# Patient Record
Sex: Male | Born: 1955 | Race: Black or African American | Hispanic: No | Marital: Married | Smoking: Former smoker
Health system: Southern US, Community
[De-identification: ages and names within clinical notes are randomized; demographics above are authoritative.]

## PROBLEM LIST (undated history)

## (undated) DIAGNOSIS — I10 Essential (primary) hypertension: Secondary | ICD-10-CM

## (undated) DIAGNOSIS — K219 Gastro-esophageal reflux disease without esophagitis: Secondary | ICD-10-CM

## (undated) DIAGNOSIS — R609 Edema, unspecified: Secondary | ICD-10-CM

## (undated) DIAGNOSIS — E669 Obesity, unspecified: Secondary | ICD-10-CM

## (undated) DIAGNOSIS — D649 Anemia, unspecified: Secondary | ICD-10-CM

## (undated) DIAGNOSIS — B1920 Unspecified viral hepatitis C without hepatic coma: Secondary | ICD-10-CM

## (undated) DIAGNOSIS — N529 Male erectile dysfunction, unspecified: Secondary | ICD-10-CM

## (undated) DIAGNOSIS — E782 Mixed hyperlipidemia: Secondary | ICD-10-CM

## (undated) DIAGNOSIS — R809 Proteinuria, unspecified: Secondary | ICD-10-CM

## (undated) DIAGNOSIS — E538 Deficiency of other specified B group vitamins: Secondary | ICD-10-CM

## (undated) DIAGNOSIS — E785 Hyperlipidemia, unspecified: Secondary | ICD-10-CM

## (undated) DIAGNOSIS — R0602 Shortness of breath: Secondary | ICD-10-CM

## (undated) DIAGNOSIS — E7800 Pure hypercholesterolemia, unspecified: Secondary | ICD-10-CM

## (undated) HISTORY — DX: Proteinuria, unspecified: R80.9

## (undated) HISTORY — PX: CERVICAL DISC SURGERY: SHX588

## (undated) HISTORY — DX: Male erectile dysfunction, unspecified: N52.9

## (undated) HISTORY — DX: Essential (primary) hypertension: I10

## (undated) HISTORY — DX: Hyperlipidemia, unspecified: E78.5

## (undated) HISTORY — PX: PENILE PROSTHESIS IMPLANT: SHX240

## (undated) HISTORY — DX: Obesity, unspecified: E66.9

## (undated) HISTORY — DX: Edema, unspecified: R60.9

## (undated) HISTORY — DX: Gastro-esophageal reflux disease without esophagitis: K21.9

## (undated) HISTORY — DX: Pure hypercholesterolemia, unspecified: E78.00

## (undated) HISTORY — DX: Anemia, unspecified: D64.9

## (undated) HISTORY — PX: MANDIBLE FRACTURE SURGERY: SHX706

## (undated) HISTORY — DX: Mixed hyperlipidemia: E78.2

## (undated) HISTORY — DX: Deficiency of other specified B group vitamins: E53.8

## (undated) HISTORY — DX: Unspecified viral hepatitis C without hepatic coma: B19.20

---

## 2004-01-29 ENCOUNTER — Emergency Department (HOSPITAL_COMMUNITY): Admission: EM | Admit: 2004-01-29 | Discharge: 2004-01-29 | Payer: Self-pay | Admitting: Emergency Medicine

## 2004-02-03 ENCOUNTER — Ambulatory Visit: Payer: Self-pay | Admitting: Internal Medicine

## 2004-02-03 ENCOUNTER — Ambulatory Visit: Payer: Self-pay | Admitting: *Deleted

## 2004-02-18 ENCOUNTER — Ambulatory Visit: Payer: Self-pay | Admitting: *Deleted

## 2004-03-06 ENCOUNTER — Ambulatory Visit: Payer: Self-pay | Admitting: Internal Medicine

## 2004-04-12 ENCOUNTER — Ambulatory Visit: Payer: Self-pay | Admitting: Internal Medicine

## 2004-05-02 ENCOUNTER — Ambulatory Visit: Payer: Self-pay | Admitting: Internal Medicine

## 2004-05-05 ENCOUNTER — Emergency Department (HOSPITAL_COMMUNITY): Admission: EM | Admit: 2004-05-05 | Discharge: 2004-05-05 | Payer: Self-pay | Admitting: Emergency Medicine

## 2004-05-15 ENCOUNTER — Ambulatory Visit: Payer: Self-pay | Admitting: Internal Medicine

## 2004-05-15 ENCOUNTER — Ambulatory Visit (HOSPITAL_COMMUNITY): Admission: RE | Admit: 2004-05-15 | Discharge: 2004-05-15 | Payer: Self-pay | Admitting: Internal Medicine

## 2004-06-02 ENCOUNTER — Ambulatory Visit: Payer: Self-pay | Admitting: Internal Medicine

## 2004-07-03 ENCOUNTER — Ambulatory Visit: Payer: Self-pay | Admitting: Internal Medicine

## 2004-07-31 ENCOUNTER — Ambulatory Visit: Payer: Self-pay | Admitting: Internal Medicine

## 2004-08-18 ENCOUNTER — Encounter: Admission: RE | Admit: 2004-08-18 | Discharge: 2004-08-18 | Payer: Self-pay | Admitting: Orthopaedic Surgery

## 2004-09-26 ENCOUNTER — Ambulatory Visit: Payer: Self-pay | Admitting: Internal Medicine

## 2004-10-04 ENCOUNTER — Ambulatory Visit: Payer: Self-pay | Admitting: Cardiology

## 2004-10-13 ENCOUNTER — Encounter (INDEPENDENT_AMBULATORY_CARE_PROVIDER_SITE_OTHER): Payer: Self-pay | Admitting: *Deleted

## 2004-10-13 ENCOUNTER — Ambulatory Visit (HOSPITAL_COMMUNITY): Admission: RE | Admit: 2004-10-13 | Discharge: 2004-10-13 | Payer: Self-pay | Admitting: Internal Medicine

## 2005-05-01 ENCOUNTER — Ambulatory Visit (HOSPITAL_BASED_OUTPATIENT_CLINIC_OR_DEPARTMENT_OTHER): Admission: RE | Admit: 2005-05-01 | Discharge: 2005-05-01 | Payer: Self-pay | Admitting: Internal Medicine

## 2005-05-06 ENCOUNTER — Ambulatory Visit: Payer: Self-pay | Admitting: Internal Medicine

## 2005-06-14 ENCOUNTER — Inpatient Hospital Stay (HOSPITAL_COMMUNITY): Admission: RE | Admit: 2005-06-14 | Discharge: 2005-06-15 | Payer: Self-pay | Admitting: Orthopaedic Surgery

## 2006-12-16 ENCOUNTER — Emergency Department (HOSPITAL_COMMUNITY): Admission: EM | Admit: 2006-12-16 | Discharge: 2006-12-16 | Payer: Self-pay | Admitting: Emergency Medicine

## 2007-09-25 ENCOUNTER — Encounter: Admission: RE | Admit: 2007-09-25 | Discharge: 2007-09-25 | Payer: Self-pay | Admitting: Family Medicine

## 2008-01-20 ENCOUNTER — Encounter: Admission: RE | Admit: 2008-01-20 | Discharge: 2008-01-20 | Payer: Self-pay | Admitting: Family Medicine

## 2008-11-02 ENCOUNTER — Emergency Department (HOSPITAL_COMMUNITY): Admission: EM | Admit: 2008-11-02 | Discharge: 2008-11-03 | Payer: Self-pay | Admitting: Emergency Medicine

## 2009-01-18 ENCOUNTER — Encounter: Admission: RE | Admit: 2009-01-18 | Discharge: 2009-01-18 | Payer: Self-pay | Admitting: Family Medicine

## 2009-05-14 ENCOUNTER — Emergency Department (HOSPITAL_COMMUNITY): Admission: EM | Admit: 2009-05-14 | Discharge: 2009-05-15 | Payer: Self-pay | Admitting: Emergency Medicine

## 2009-05-23 ENCOUNTER — Encounter: Admission: RE | Admit: 2009-05-23 | Discharge: 2009-05-23 | Payer: Self-pay | Admitting: Family Medicine

## 2009-09-18 ENCOUNTER — Emergency Department (HOSPITAL_COMMUNITY): Admission: EM | Admit: 2009-09-18 | Discharge: 2009-09-18 | Payer: Self-pay | Admitting: Emergency Medicine

## 2010-05-24 ENCOUNTER — Observation Stay (HOSPITAL_COMMUNITY)
Admission: EM | Admit: 2010-05-24 | Discharge: 2010-05-28 | Payer: Self-pay | Source: Home / Self Care | Attending: Internal Medicine | Admitting: Internal Medicine

## 2010-05-26 ENCOUNTER — Encounter (INDEPENDENT_AMBULATORY_CARE_PROVIDER_SITE_OTHER): Payer: Self-pay | Admitting: Internal Medicine

## 2010-06-25 ENCOUNTER — Encounter: Payer: Self-pay | Admitting: Orthopaedic Surgery

## 2010-08-14 LAB — URINE CULTURE
Colony Count: NO GROWTH
Culture: NO GROWTH
Special Requests: NEGATIVE

## 2010-08-14 LAB — BASIC METABOLIC PANEL
BUN: 13 mg/dL (ref 6–23)
CO2: 25 mEq/L (ref 19–32)
Calcium: 9.1 mg/dL (ref 8.4–10.5)
Chloride: 104 mEq/L (ref 96–112)
Creatinine, Ser: 1.09 mg/dL (ref 0.4–1.5)
GFR calc Af Amer: >60 mL/min (ref >60)
GFR calc non Af Amer: >60 mL/min (ref >60)
Glucose, Bld: 183 mg/dL — ABNORMAL HIGH (ref 70–99)
Potassium: 4.1 mEq/L (ref 3.5–5.1)
Sodium: 135 mEq/L (ref 135–145)

## 2010-08-14 LAB — CBC
HCT: 35.1 % — ABNORMAL LOW (ref 39.0–52.0)
HCT: 36.3 % — ABNORMAL LOW (ref 39.0–52.0)
Hemoglobin: 11.5 g/dL — ABNORMAL LOW (ref 13.0–17.0)
Hemoglobin: 11.9 g/dL — ABNORMAL LOW (ref 13.0–17.0)
Hemoglobin: 12.4 g/dL — ABNORMAL LOW (ref 13.0–17.0)
MCH: 28.6 pg (ref 26.0–34.0)
MCH: 28.7 pg (ref 26.0–34.0)
MCH: 28.8 pg (ref 26.0–34.0)
MCHC: 34 g/dL (ref 30.0–36.0)
MCHC: 34.2 g/dL (ref 30.0–36.0)
MCV: 84 fL (ref 78.0–100.0)
Platelets: 63 10*3/uL — ABNORMAL LOW (ref 150–400)
Platelets: 73 10*3/uL — ABNORMAL LOW (ref 150–400)
RBC: 4.02 MIL/uL — ABNORMAL LOW (ref 4.22–5.81)
RBC: 4.32 MIL/uL (ref 4.22–5.81)
RDW: 13.3 % (ref 11.5–15.5)
RDW: 13.4 % (ref 11.5–15.5)
WBC: 6 10*3/uL (ref 4.0–10.5)
WBC: 6.2 10*3/uL (ref 4.0–10.5)
WBC: 8.5 10*3/uL (ref 4.0–10.5)

## 2010-08-14 LAB — GLUCOSE, CAPILLARY
Glucose-Capillary: 173 mg/dL — ABNORMAL HIGH (ref 70–99)
Glucose-Capillary: 173 mg/dL — ABNORMAL HIGH (ref 70–99)
Glucose-Capillary: 186 mg/dL — ABNORMAL HIGH (ref 70–99)
Glucose-Capillary: 191 mg/dL — ABNORMAL HIGH (ref 70–99)
Glucose-Capillary: 244 mg/dL — ABNORMAL HIGH (ref 70–99)
Glucose-Capillary: 292 mg/dL — ABNORMAL HIGH (ref 70–99)

## 2010-08-14 LAB — LIPID PANEL
Cholesterol: 153 mg/dL (ref 0–200)
HDL: 45 mg/dL (ref >39)
LDL Cholesterol: 83 mg/dL (ref 0–99)
Total CHOL/HDL Ratio: 3.4 RATIO
Triglycerides: 127 mg/dL (ref <150)
VLDL: 25 mg/dL (ref 0–40)

## 2010-08-14 LAB — DIFFERENTIAL
Basophils Absolute: 0 10*3/uL (ref 0.0–0.1)
Basophils Absolute: 0 10*3/uL (ref 0.0–0.1)
Basophils Relative: 0 % (ref 0–1)
Basophils Relative: 0 % (ref 0–1)
Eosinophils Absolute: 0 10*3/uL (ref 0.0–0.7)
Eosinophils Absolute: 0.1 10*3/uL (ref 0.0–0.7)
Eosinophils Relative: 1 % (ref 0–5)
Lymphocytes Relative: 15 % (ref 12–46)
Lymphs Abs: 0.9 10*3/uL (ref 0.7–4.0)
Monocytes Absolute: 1 10*3/uL (ref 0.1–1.0)
Monocytes Relative: 13 % — ABNORMAL HIGH (ref 3–12)
Monocytes Relative: 16 % — ABNORMAL HIGH (ref 3–12)
Neutro Abs: 4.1 10*3/uL (ref 1.7–7.7)
Neutrophils Relative %: 59 % (ref 43–77)
Neutrophils Relative %: 68 % (ref 43–77)

## 2010-08-14 LAB — URINALYSIS, ROUTINE W REFLEX MICROSCOPIC
Bilirubin Urine: NEGATIVE
Hgb urine dipstick: NEGATIVE
Protein, ur: 30 mg/dL — AB
Specific Gravity, Urine: 1.015 (ref 1.005–1.030)
Urobilinogen, UA: 1 mg/dL (ref 0.0–1.0)

## 2010-08-14 LAB — POCT CARDIAC MARKERS
CKMB, poc: <1 ng/mL — ABNORMAL LOW (ref 1.0–8.0)
Troponin i, poc: <0.05 ng/mL (ref 0.00–0.09)
Troponin i, poc: <0.05 ng/mL (ref 0.00–0.09)

## 2010-08-14 LAB — COMPREHENSIVE METABOLIC PANEL
ALT: 102 U/L — ABNORMAL HIGH (ref 0–53)
ALT: 78 U/L — ABNORMAL HIGH (ref 0–53)
AST: 105 U/L — ABNORMAL HIGH (ref 0–37)
AST: 80 U/L — ABNORMAL HIGH (ref 0–37)
Alkaline Phosphatase: 32 U/L — ABNORMAL LOW (ref 39–117)
Alkaline Phosphatase: 46 U/L (ref 39–117)
CO2: 28 mEq/L (ref 19–32)
CO2: 28 mEq/L (ref 19–32)
Chloride: 101 mEq/L (ref 96–112)
Chloride: 106 mEq/L (ref 96–112)
Creatinine, Ser: 0.95 mg/dL (ref 0.4–1.5)
GFR calc Af Amer: >60 mL/min (ref >60)
GFR calc Af Amer: >60 mL/min (ref >60)
GFR calc non Af Amer: 54 mL/min — ABNORMAL LOW (ref >60)
GFR calc non Af Amer: >60 mL/min (ref >60)
Potassium: 3.8 mEq/L (ref 3.5–5.1)
Sodium: 138 mEq/L (ref 135–145)
Sodium: 139 mEq/L (ref 135–145)
Total Bilirubin: 1.2 mg/dL (ref 0.3–1.2)
Total Bilirubin: 1.3 mg/dL — ABNORMAL HIGH (ref 0.3–1.2)

## 2010-08-14 LAB — BLOOD GAS, VENOUS
Acid-Base Excess: 1.1 mmol/L (ref 0.0–2.0)
O2 Saturation: 82 %
TCO2: 22.5 mmol/L (ref 0–100)
pCO2, Ven: 39.1 mmHg — ABNORMAL LOW (ref 45.0–50.0)
pO2, Ven: 42.6 mmHg (ref 30.0–45.0)

## 2010-08-14 LAB — CULTURE, BLOOD (ROUTINE X 2)
Culture  Setup Time: 201112230218
Culture: NO GROWTH

## 2010-08-14 LAB — MRSA CULTURE

## 2010-08-14 LAB — HEMOGLOBIN A1C
Hgb A1c MFr Bld: 9.6 % — ABNORMAL HIGH (ref <5.7)
Mean Plasma Glucose: 229 mg/dL — ABNORMAL HIGH (ref <117)

## 2010-08-14 LAB — PROTIME-INR
INR: 1.1 (ref 0.00–1.49)
Prothrombin Time: 14.4 seconds (ref 11.6–15.2)

## 2010-08-14 LAB — CARDIAC PANEL(CRET KIN+CKTOT+MB+TROPI)
CK, MB: 0.9 ng/mL (ref 0.3–4.0)
Total CK: 404 U/L — ABNORMAL HIGH (ref 7–232)
Troponin I: 0.01 ng/mL (ref 0.00–0.06)
Troponin I: 0.01 ng/mL (ref 0.00–0.06)

## 2010-08-14 LAB — HEPATIC FUNCTION PANEL
Albumin: 3.4 g/dL — ABNORMAL LOW (ref 3.5–5.2)
Total Bilirubin: 1.3 mg/dL — ABNORMAL HIGH (ref 0.3–1.2)
Total Protein: 7.5 g/dL (ref 6.0–8.3)

## 2010-08-14 LAB — URINE MICROSCOPIC-ADD ON

## 2010-08-14 LAB — HEMOCCULT GUIAC POC 1CARD (OFFICE): Fecal Occult Bld: NEGATIVE

## 2010-08-22 LAB — GLUCOSE, CAPILLARY: Glucose-Capillary: 397 mg/dL — ABNORMAL HIGH (ref 70–99)

## 2010-09-05 LAB — POCT I-STAT, CHEM 8
BUN: 21 mg/dL (ref 6–23)
Calcium, Ion: 1.16 mmol/L (ref 1.12–1.32)
HCT: 41 % (ref 39.0–52.0)
Sodium: 130 mEq/L — ABNORMAL LOW (ref 135–145)
TCO2: 25 mmol/L (ref 0–100)

## 2010-09-05 LAB — GLUCOSE, CAPILLARY
Glucose-Capillary: 449 mg/dL — ABNORMAL HIGH (ref 70–99)
Glucose-Capillary: 582 mg/dL (ref 70–99)

## 2010-09-05 LAB — DIFFERENTIAL
Basophils Absolute: 0 10*3/uL (ref 0.0–0.1)
Basophils Relative: 0 % (ref 0–1)
Eosinophils Relative: 0 % (ref 0–5)
Lymphocytes Relative: 26 % (ref 12–46)
Monocytes Absolute: 0.4 10*3/uL (ref 0.1–1.0)
Neutro Abs: 3.2 10*3/uL (ref 1.7–7.7)

## 2010-09-05 LAB — URINALYSIS, ROUTINE W REFLEX MICROSCOPIC
Bilirubin Urine: NEGATIVE
Ketones, ur: NEGATIVE mg/dL
Nitrite: NEGATIVE
Specific Gravity, Urine: 1.025 (ref 1.005–1.030)
Urobilinogen, UA: 1 mg/dL (ref 0.0–1.0)

## 2010-09-05 LAB — KETONES, QUALITATIVE: Acetone, Bld: NEGATIVE

## 2010-09-05 LAB — CBC
HCT: 38.5 % — ABNORMAL LOW (ref 39.0–52.0)
MCHC: 34 g/dL (ref 30.0–36.0)
Platelets: 64 10*3/uL — ABNORMAL LOW (ref 150–400)
RDW: 13.7 % (ref 11.5–15.5)

## 2010-09-11 LAB — COMPREHENSIVE METABOLIC PANEL
ALT: 83 U/L — ABNORMAL HIGH (ref 0–53)
AST: 51 U/L — ABNORMAL HIGH (ref 0–37)
CO2: 24 mEq/L (ref 19–32)
Chloride: 93 mEq/L — ABNORMAL LOW (ref 96–112)
GFR calc Af Amer: >60 mL/min (ref >60)
GFR calc non Af Amer: 59 mL/min — ABNORMAL LOW (ref >60)
Sodium: 126 mEq/L — ABNORMAL LOW (ref 135–145)
Total Bilirubin: 1.4 mg/dL — ABNORMAL HIGH (ref 0.3–1.2)

## 2010-09-11 LAB — GLUCOSE, CAPILLARY
Glucose-Capillary: 305 mg/dL — ABNORMAL HIGH (ref 70–99)
Glucose-Capillary: 351 mg/dL — ABNORMAL HIGH (ref 70–99)
Glucose-Capillary: 375 mg/dL — ABNORMAL HIGH (ref 70–99)
Glucose-Capillary: >600 mg/dL (ref 70–99)
Glucose-Capillary: >600 mg/dL (ref 70–99)

## 2010-09-11 LAB — URINALYSIS, ROUTINE W REFLEX MICROSCOPIC
Bilirubin Urine: NEGATIVE
Ketones, ur: NEGATIVE mg/dL
Nitrite: NEGATIVE
pH: 5.5 (ref 5.0–8.0)

## 2010-09-11 LAB — DIFFERENTIAL
Basophils Absolute: 0 10*3/uL (ref 0.0–0.1)
Eosinophils Absolute: 0.1 10*3/uL (ref 0.0–0.7)
Eosinophils Relative: 1 % (ref 0–5)

## 2010-09-11 LAB — CBC
RBC: 4.57 MIL/uL (ref 4.22–5.81)
WBC: 5.7 10*3/uL (ref 4.0–10.5)

## 2010-09-15 ENCOUNTER — Emergency Department (HOSPITAL_COMMUNITY)
Admission: EM | Admit: 2010-09-15 | Discharge: 2010-09-15 | Disposition: A | Discharge status: Home / Self Care | Payer: Medicare PPO | Attending: Emergency Medicine | Admitting: Emergency Medicine

## 2010-09-15 DIAGNOSIS — K219 Gastro-esophageal reflux disease without esophagitis: Secondary | ICD-10-CM | POA: Insufficient documentation

## 2010-09-15 DIAGNOSIS — R109 Unspecified abdominal pain: Secondary | ICD-10-CM | POA: Insufficient documentation

## 2010-09-15 DIAGNOSIS — I10 Essential (primary) hypertension: Secondary | ICD-10-CM | POA: Insufficient documentation

## 2010-09-15 DIAGNOSIS — E119 Type 2 diabetes mellitus without complications: Secondary | ICD-10-CM | POA: Insufficient documentation

## 2010-09-15 LAB — DIFFERENTIAL
Basophils Absolute: 0 10*3/uL (ref 0.0–0.1)
Basophils Relative: 0 % (ref 0–1)
Neutro Abs: 3.5 10*3/uL (ref 1.7–7.7)
Neutrophils Relative %: 59 % (ref 43–77)

## 2010-09-15 LAB — URINALYSIS, ROUTINE W REFLEX MICROSCOPIC
Ketones, ur: NEGATIVE mg/dL
Leukocytes, UA: NEGATIVE
Nitrite: NEGATIVE
Protein, ur: 100 mg/dL — AB
Urobilinogen, UA: 1 mg/dL (ref 0.0–1.0)
pH: 5 (ref 5.0–8.0)

## 2010-09-15 LAB — CBC
Hemoglobin: 12.6 g/dL — ABNORMAL LOW (ref 13.0–17.0)
Platelets: 81 10*3/uL — ABNORMAL LOW (ref 150–400)
RBC: 4.42 MIL/uL (ref 4.22–5.81)
WBC: 6 10*3/uL (ref 4.0–10.5)

## 2010-09-15 LAB — COMPREHENSIVE METABOLIC PANEL
ALT: 86 U/L — ABNORMAL HIGH (ref 0–53)
AST: 69 U/L — ABNORMAL HIGH (ref 0–37)
Albumin: 3.5 g/dL (ref 3.5–5.2)
CO2: 31 mEq/L (ref 19–32)
Chloride: 102 mEq/L (ref 96–112)
GFR calc Af Amer: >60 mL/min (ref >60)
GFR calc non Af Amer: >60 mL/min (ref >60)
Sodium: 139 mEq/L (ref 135–145)
Total Bilirubin: 0.9 mg/dL (ref 0.3–1.2)

## 2010-09-15 LAB — URINE MICROSCOPIC-ADD ON

## 2010-09-15 LAB — POCT CARDIAC MARKERS
CKMB, poc: 1.8 ng/mL (ref 1.0–8.0)
Myoglobin, poc: 163 ng/mL (ref 12–200)
Troponin i, poc: <0.05 ng/mL (ref 0.00–0.09)

## 2010-10-20 NOTE — Procedures (Signed)
Fernando Peterson, Fernando Peterson                ACCOUNT NO.:  [id number]   MEDICAL RECORD NO.:  [id number]          PATIENT TYPE:  OUT   LOCATION:  SLEEP CENTER                 FACILITY:  [facility]   PHYSICIAN:  Clinton D. Maple Hudson, M.D. DATE OF BIRTH:  11-14-1955   DATE OF STUDY:                              NOCTURNAL POLYSOMNOGRAM   REFERRING PHYSICIAN:  Dr. Dorise Hiss.   DATE OF STUDY:  May 01, 2005.   INDICATION FOR STUDY:  Insomnia with sleep apnea.   EPWORTH SLEEPINESS SCORE:  8/24.   BMI:  32.   WEIGHT:  234 pounds.   SLEEP ARCHITECTURE:  Total sleep time 339 minutes with sleep efficiency 77%.  Stage I was 24%, stage II 63%, stages III and IV were absent, REM 13% of  total sleep time. Sleep latency 29 minutes, REM latency 81 minutes, awake  after sleep onset 74 minutes, arousal index of 30.8. Ambien was taken at  bedtime.   RESPIRATORY DATA:  Split study protocol. Apnea/hypopnea index (AHI, RDI)  61.2 obstructive events per hour indicating severe obstructive sleep  apnea/hypopnea syndrome before C-PAP. This included 33 obstructive apneas  and 99 hypopneas before C-PAP. Events were not positional but more common  while supine. REM AHI 14.8 per hour. C-PAP was successfully titrated to 16  CWP, AHI 1.3 per hour. A large ultra mirage ResMed full-face mask was used  with heated humidifier.   OXYGEN DATA:  Moderate to loud snoring with oxygen desaturation to a nadir  of 74% before C-PAP. After C-PAP titration, saturation held 94-96% on room  air.   CARDIAC DATA:  Normal sinus rhythm.   MOVEMENT/PARASOMNIA:  Frequent limb jerks with a total of 216 counted, but  only 9 were associated with arousal or awakening for an unremarkable  periodic limb movement with arousal index of 1.6 per hour.   IMPRESSION/RECOMMENDATIONS:  1.  Severe obstructive sleep apnea/hypopnea syndrome, AHI 61.2 per hour      nonpositional events with moderate to loud snoring and oxygen      desaturation to  74%.  2.  Successful C-PAP titration to 16 CWP, AHI 1.3 per hour. A large ultra      mirage ResMed full-face mask was used with a heated humidifier.      Clinton D. Maple Hudson, M.D.  Diplomate, Biomedical engineer of Sleep Medicine  Electronically Signed     CDY/MEDQ  D:  05/06/2005 10:32:21  T:  05/06/2005 22:45:33  Job:  [id number]

## 2010-10-20 NOTE — Op Note (Signed)
NAMEJOSEAN, LYCAN                ACCOUNT NO.:  [id number]   MEDICAL RECORD NO.:  [id number]          PATIENT TYPE:  OIB   LOCATION:  3027                         FACILITY:  [facility]   PHYSICIAN:  Sharolyn Douglas, M.D.        DATE OF BIRTH:  02-Jun-1956   DATE OF PROCEDURE:  06/14/2005  DATE OF DISCHARGE:  06/15/2005                                 OPERATIVE REPORT   PREOPERATIVE DIAGNOSIS:  1.  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  2.  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  3.  Chronic neck and right greater than left upper extremity numbness and      pain.   PROCEDURE:  1.  Exploration of C5-6 anterior cervical fusion with removal of anterior      cervical plate.  2.  Anterior cervical diskectomy C4-5.  3.  C6 corpectomy with take down of pseudoarthrosis.  4.  Anterior cervical fusion C4-5, C5-6 and C6-7.  5.  Allograft strut x2 packed with local autogenous bone graft.  6.  Anterior cervical plating C4-C7.  7.  Operating microscope.   SURGEON:  Sharolyn Douglas, M.D.   ASSISTANT:  Verlin Fester, P.A.   ANESTHESIA:  General endotracheal.   COMPLICATIONS:  None.   ESTIMATED BLOOD LOSS:  50 cc.   INDICATIONS:  The patient is a pleasant 55 year old male with chronic  progressive neck and right greater than left upper extremity pain, numbness  and tingling.  He had a previous anterior cervical fusion done five 5 years  ago by another Careers adviser.  He now has a pseudoarthrosis at C5-6, foraminal  narrowing at C4-5.  The graft has subsided into the C6 vertebral body, and  the plate has collapsed.  In order to reconstruct the anterior cervical  spine, C6 corpectomy needed to be done so that a strut could be placed from  C5-C7 and instrumentation from C4-C7 after ACDF at C4-5 to address foraminal  narrowing. Originally, we had hoped to limit the surgery from C4-C6, but  after careful review of the CT scan it became apparent that there was  not  enough vertebral body of C6 left to complete the reconstruction.  This was  reviewed with the patient.  He understands and elected to proceed.  Risks  and benefits also extensively discussed.   DESCRIPTION OF PROCEDURE:  The patient was identified in the holding area,  taken to the operating room and underwent general endotracheal anesthesia  without difficulty, given prophylactic IV antibiotics.  Carefully positioned  on the operating room table with Mayfield headrest and  5 pounds of halter  traction applied.  Neck prepped and draped in the usual sterile fashion. A  transverse incision was made at the level of the cricoid cartilage.  Dissection was carried sharply through platysma.  The interval between the  SCM and strap muscles was developed down to the prevertebral space.  The  previous scar was encountered and careful dissection was completed to  identify the esophagus, trachea and carotid sheath. These structures were  then protected at  all times.  The prevertebral fascia was opened and a wide  exposure was carried out of the anterior cervical spine from C4 down to C7.  The previous plate was identified.  This was cleaned of all soft tissue.  Deep retractors were placed.  The plate was then removed using the  appropriate DePuy screwdrivers.  There was wear debris around the screws and  plate indicating that there was ongoing motion. Once the hardware had been  removed, we then completed a careful evaluation of the fusion at C5-6.  There was a clear pseudoarthrosis with subsidence of the graft into the C6  vertebral body.  The Leica microscope was draped and brought into the field.  Caspar distraction pins were placed into the V4-C5 vertebral bodies and  distraction applied. We then completed diskectomy at C4-5.  High-speed bur  used to take down the cartilaginous endplates at the posterior vertebral  margins and the uncovertebral joints.  A 2 mm Kerrison used to complete   decompression, undercutting the vertebral margins and completing wide  foraminotomies.  We then placed a 9 mm Allograft prosthesis spacer which was  packed with local bone graft into the interspace and carefully countersunk 2  mm.  We then removed the Caspar distraction pin from C4 and placed this into  the C7 vertebral body.  We applied distraction across the C5-6 and C6-7 disk  space.  The disk was completely removed between C6 and C7.  Foraminotomies  were completed, and the C7 end-plate was carefully prepared.  We then used a  bur to complete the corpectomy of C6 back to the posterior vertebral wall.  We carried the corpectomy up through the previous pseudoarthrosis until we  reached good bone at C5.  This was carefully contoured creating a level  surface.  We completed foraminotomies.  We then measured and cut a fibular  Allograft strut.  This was packed with local bone graft obtained from the  corpectomy.  It was carefully countersunk into the interspace.  The  carpentry was good, and there was excellent contact with the end-plates.  We  then packed the remaining local bone graft from the corpectomy around the  Allograft strut.  At this point, we turned our attention to placing anterior  cervical plate from the J1-B1 from the [employer] spine system. We used six 13  mm locking screws.  We had excellent screw purchase.  Screws were placed to  the C4-C5 and C7 vertebral bodies.  We ensured that the locking mechanism  engaged.  The wound was irrigated.  X-ray taken showed appropriate levels  and positioning of the implant.  The esophagus, trachea and carotid sheath  were inspected. There were no apparent injuries.  Deep Penrose drain was  left.  The platysma was closed with interrupted 2-0 Vicryl, subcutaneous  layer closed with interrupted 3-0 Vicryl and the skin approximated with a 3- 0 subcuticular Vicryl suture.  Benzoin and Steri-Strips placed.  Sterile  dressing applied.  The patient  was placed into a cervical collar, extubated  without difficulty and transferred to recovery in stable condition.  Evaluation in the he recovery room postoperatively showed good strength.  The patient was reporting improved pain and sensation in the right upper  extremity.      Sharolyn Douglas, M.D.  Electronically Signed     MC/MEDQ  D:  06/15/2005  T:  06/17/2005  Job:  [id number]

## 2010-12-23 ENCOUNTER — Emergency Department (HOSPITAL_COMMUNITY): Payer: Medicare PPO

## 2010-12-23 ENCOUNTER — Emergency Department (HOSPITAL_COMMUNITY)
Admission: EM | Admit: 2010-12-23 | Discharge: 2010-12-23 | Disposition: A | Discharge status: Home / Self Care | Payer: Medicare PPO | Attending: Emergency Medicine | Admitting: Emergency Medicine

## 2010-12-23 DIAGNOSIS — Z8619 Personal history of other infectious and parasitic diseases: Secondary | ICD-10-CM | POA: Insufficient documentation

## 2010-12-23 DIAGNOSIS — K7460 Unspecified cirrhosis of liver: Secondary | ICD-10-CM | POA: Insufficient documentation

## 2010-12-23 DIAGNOSIS — I10 Essential (primary) hypertension: Secondary | ICD-10-CM | POA: Insufficient documentation

## 2010-12-23 DIAGNOSIS — R109 Unspecified abdominal pain: Secondary | ICD-10-CM | POA: Insufficient documentation

## 2010-12-23 DIAGNOSIS — Z794 Long term (current) use of insulin: Secondary | ICD-10-CM | POA: Insufficient documentation

## 2010-12-23 DIAGNOSIS — E119 Type 2 diabetes mellitus without complications: Secondary | ICD-10-CM | POA: Insufficient documentation

## 2010-12-23 DIAGNOSIS — R748 Abnormal levels of other serum enzymes: Secondary | ICD-10-CM | POA: Insufficient documentation

## 2010-12-23 DIAGNOSIS — K8020 Calculus of gallbladder without cholecystitis without obstruction: Secondary | ICD-10-CM | POA: Insufficient documentation

## 2010-12-23 LAB — GLUCOSE, CAPILLARY: Glucose-Capillary: 345 mg/dL — ABNORMAL HIGH (ref 70–99)

## 2010-12-23 LAB — DIFFERENTIAL
Basophils Absolute: 0 10*3/uL (ref 0.0–0.1)
Basophils Relative: 0 % (ref 0–1)
Lymphocytes Relative: 24 % (ref 12–46)
Monocytes Absolute: 0.5 10*3/uL (ref 0.1–1.0)
Monocytes Relative: 10 % (ref 3–12)
Neutro Abs: 3.6 10*3/uL (ref 1.7–7.7)
Neutrophils Relative %: 65 % (ref 43–77)

## 2010-12-23 LAB — CK TOTAL AND CKMB (NOT AT ARMC): Relative Index: 1.3 (ref 0.0–2.5)

## 2010-12-23 LAB — CBC
MCH: 27.8 pg (ref 26.0–34.0)
MCHC: 34.1 g/dL (ref 30.0–36.0)
Platelets: 81 10*3/uL — ABNORMAL LOW (ref 150–400)
RBC: 4.53 MIL/uL (ref 4.22–5.81)

## 2010-12-23 LAB — LIPASE, BLOOD: Lipase: 44 U/L (ref 11–59)

## 2010-12-23 LAB — COMPREHENSIVE METABOLIC PANEL
ALT: 107 U/L — ABNORMAL HIGH (ref 0–53)
AST: 87 U/L — ABNORMAL HIGH (ref 0–37)
Albumin: 3.6 g/dL (ref 3.5–5.2)
Calcium: 9.6 mg/dL (ref 8.4–10.5)
Sodium: 132 mEq/L — ABNORMAL LOW (ref 135–145)
Total Protein: 8.2 g/dL (ref 6.0–8.3)

## 2010-12-29 ENCOUNTER — Encounter (INDEPENDENT_AMBULATORY_CARE_PROVIDER_SITE_OTHER): Payer: Self-pay | Admitting: General Surgery

## 2010-12-29 ENCOUNTER — Ambulatory Visit (INDEPENDENT_AMBULATORY_CARE_PROVIDER_SITE_OTHER): Payer: Medicare PPO | Admitting: General Surgery

## 2010-12-29 VITALS — BP 130/78 | HR 72 | Temp 97.6°F | Ht 71.0 in | Wt 256.0 lb

## 2010-12-29 DIAGNOSIS — K8020 Calculus of gallbladder without cholecystitis without obstruction: Secondary | ICD-10-CM

## 2010-12-29 NOTE — Progress Notes (Signed)
Subjective:     Fernando Peterson ID: Fernando Peterson, male   DOB: [birth year], 55 y.o.   MRN: [id number]  HPI This is a pleasant 55 year old African American Fernando Peterson with insulin-dependent diabetes, hypertension, coronary artery disease, morbid obesity, hepatitis C related cirrhosis. Fernando Peterson is referred to me by Dr. Mirna Mires because Fernando Peterson had another gallbladder attack.  I saw Fernando Peterson in the hospital on May 26, 2010. Fernando Peterson had had abdominal pain nausea and vomiting and was found to have gallstones on ultrasound. Fernando Peterson had a cardiac workup which was negative for myocardial infarction. His symptoms resolved in the hospital. Hepatobiliary scan showed nonvisualization of the gallbladder. For some reason Fernando Peterson elected not to have gallbladder surgery at that time. Fernando Peterson was to be referred to gastroenterology for evaluation of his cirrhosis, but it appears that that has never been done.  Over the past 5 months Fernando Peterson has done well and really hasn't had any more attacks. On December 22, 2010 Fernando Peterson went out and ate a big dinner with fried seafood. Fernando Peterson woke up in the early morning hours with upper abdominal and right upper quadrant and back pain, repeated nausea and vomiting, and Fernando Peterson went to the [facility] emergency room. CBC was normal. Complete metabolic panel revealed SGOT of 87, SGPT of 107 and otherwise normal. Lipase was 44. Cardiac enzymes were negative. Gallbladder ultrasound was repeated and showed clustered calcified gallstones, negative sonographic Murphy's sign, no wall thickening, no pericholecystic fluid. Hepatic cirrhosis was commented. Mild splenomegaly was commented on. Fernando Peterson has been asymptomatic since. Dr. Loleta Chance wanted my opinion  I have discussed the situation with the Fernando Peterson, and Fernando Peterson is motivated towards having a gallbladder operation at some point. Fernando Peterson is afraid of complications in the future.  Past Medical History  Diagnosis Date  . Hypertension   . Diabetes mellitus   . GERD (gastroesophageal reflux disease)   . Hepatitis C      Current Outpatient Prescriptions  Medication Sig Dispense Refill  . carvedilol (COREG) 12.5 MG tablet Take 12.5 mg by mouth 2 (two) times daily with a meal.        . colesevelam (WELCHOL) 625 MG tablet Take 1,875 mg by mouth 2 (two) times daily with a meal.        . insulin aspart (NOVOLOG) 100 UNIT/ML injection Inject 28 Units into the skin 3 (three) times daily before meals.        . insulin glargine (LANTUS) 100 UNIT/ML injection Inject 80 Units into the skin 2 (two) times daily.        . insulin NPH (HUMULIN N,NOVOLIN N) 100 UNIT/ML injection Inject 70 Units into the skin.        . metFORMIN (GLUCOPHAGE) 850 MG tablet Take 850 mg by mouth 2 (two) times daily with a meal.        . pioglitazone (ACTOS) 15 MG tablet Take 15 mg by mouth daily.        . pramlintide (SYMLIN) 600 MCG/ML injection Inject 120 mcg into the skin 3 (three) times daily before meals.        . triamterene-hydrochlorothiazide (MAXZIDE-25) 37.5-25 MG per tablet Take 1 tablet by mouth daily.        . valsartan (DIOVAN) 160 MG tablet Take 160 mg by mouth daily.          No Known Allergies  Family History  Problem Relation Age of Onset  . Diabetes Mother   . Heart disease Mother   . Heart disease Sister   .  Diabetes Brother   . Heart disease Brother     History  Substance Use Topics  . Smoking status: Former Smoker -- 1.5 packs/day  . Smokeless tobacco: Not on file  . Alcohol Use: No      Review of Systems  Constitutional: Positive for appetite change. Negative for chills, diaphoresis, activity change, fatigue and unexpected weight change.  HENT: Negative.   Eyes: Negative.   Respiratory: Negative.   Cardiovascular: Negative.   Gastrointestinal: Positive for nausea, vomiting and abdominal pain. Negative for diarrhea, constipation, blood in stool, abdominal distention, anal bleeding and rectal pain.  Genitourinary: Negative.   Musculoskeletal: Negative.   Skin: Negative.   Neurological: Negative.    Hematological: Negative.        Objective:   Physical Exam  Constitutional: Fernando Peterson is oriented to person, place, and time. Fernando Peterson appears well-developed and well-nourished. No distress.  HENT:  Head: Normocephalic and atraumatic.  Mouth/Throat: Oropharyngeal exudate present.  Eyes: Pupils are equal, round, and reactive to light. Left eye exhibits no discharge. No scleral icterus.  Neck: Normal range of motion. Neck supple. No JVD present. No tracheal deviation present. No thyromegaly present.  Cardiovascular: Normal rate, regular rhythm and normal heart sounds.  Exam reveals no gallop.   No murmur heard. Pulmonary/Chest: Breath sounds normal. No respiratory distress. Fernando Peterson has no wheezes. Fernando Peterson has no rales. Fernando Peterson exhibits no tenderness.  Abdominal: Bowel sounds are normal. Fernando Peterson exhibits no distension and no mass. There is no tenderness. There is no rebound and no guarding.       No mass. No organomegaly. Obesity limits exam.  Musculoskeletal: Normal range of motion. Fernando Peterson exhibits no edema and no tenderness.  Lymphadenopathy:    Fernando Peterson has no cervical adenopathy.  Neurological: Fernando Peterson is alert and oriented to person, place, and time.  Skin: Skin is warm and dry. No rash noted. No erythema. No pallor.  Psychiatric: Fernando Peterson has a normal mood and affect. His behavior is normal. Judgment and thought content normal.       Assessment:     Chronic cholecystitis with cholelithiasis. Fernando Peterson has had recurrent episodes of biliary colic, but no overt complications to date. Because of his diabetes and liver disease, Fernando Peterson is at increased risk for complications of his gallstones. Because of his cirrhosis Fernando Peterson is at significant increased risk for cholecystectomy.  Hepatitis C related cirrhosis. Portal hypertension has been diagnosed by CT scan May 26, 2010. It is not clear how seriously his hepatic function has been impaired.  Insulin-dependent diabetes mellitus, followed closely by Dr. Earvin Hansen  Fernando Peterson'll.  Hypertension  Obesity  Gastroesophageal reflux disease  Status post penile implant.  Status post multiple cervical fusions and discectomies by Dr. Sharolyn Douglas.    Plan:     We had a long talk about his gallstones and the potential complications from the natural history of this. We also had a serious conversation about the risk of cholecystectomy in a Fernando Peterson with cirrhosis and portal hypertension. Fernando Peterson is aware that Fernando Peterson is at increased risk for bleeding, even possibly bleeding to death. Fernando Peterson is aware Fernando Peterson is at increased risk for converting to an open operation at increased risk for bile leak and bile duct injury. Both Fernando Peterson and I lean toward moving in the direction of having a cholecystectomy, nevertheless, because of his continued symptoms.  Fernando Peterson will be referred back to Dr. Earvin Hansen Fernando Peterson'll for consideration of referral to a liver specialist to  define the extent of his liver disease and the seriousness of  his cirrhosis. This will help me understand the risks that we are getting into somewhat better.  Fernando Peterson'll be referred to Dr. Peter [place] for cardiac clearance. I am hopeful that this will be more straightforward.  The Fernando Peterson will return to see me in 4-6 weeks for final decision making after both of these medical consultations are complete.

## 2010-12-29 NOTE — Patient Instructions (Signed)
We have discussed your gallbladder problem today, as well as your liver disease, cardiac disease, and diabetes. Because you are continuing to have gallbladder attacks, it is almost certain that you will suffer a complication from your gallbladder at some point in the future. We have discussed this and you have decided he wanted to move forward towards having a gallbladder operation. You will be referred back to Dr. Peter [place] for cardiac clearance. HYouwill be referred back to Dr. Mirna Mires for referral to a hepatologist to completely define the extent and nature of the liver disease, and to make sure that your liver disease is stable enough for you to undergo surgery. You will return to see me in 4-6 weeks after all this is done for final discussions.

## 2011-03-01 ENCOUNTER — Encounter (INDEPENDENT_AMBULATORY_CARE_PROVIDER_SITE_OTHER): Payer: Medicare PPO | Admitting: General Surgery

## 2011-03-27 ENCOUNTER — Telehealth (INDEPENDENT_AMBULATORY_CARE_PROVIDER_SITE_OTHER): Payer: Self-pay

## 2011-03-27 NOTE — Telephone Encounter (Signed)
I have lmom for pt to call re: his medical work up pre op. I see no appt in system or any notes re; clearance. The pt was to have appt with Dr Herbie Baltimore at [facility] for clearance and also see Dr Mirna Mires for clearance re: his liver disease. Once we have these notes of clearance pt is to see Dr Derrell Lolling in office to discuss GB surgery. Awaiting call back from pt.

## 2011-07-09 ENCOUNTER — Other Ambulatory Visit: Payer: Self-pay | Admitting: Family Medicine

## 2011-07-09 ENCOUNTER — Ambulatory Visit
Admission: RE | Admit: 2011-07-09 | Discharge: 2011-07-09 | Disposition: A | Discharge status: Home / Self Care | Payer: Medicare Other | Source: Ambulatory Visit | Attending: Family Medicine | Admitting: Family Medicine

## 2011-07-09 DIAGNOSIS — R52 Pain, unspecified: Secondary | ICD-10-CM

## 2013-03-21 ENCOUNTER — Encounter: Payer: Self-pay | Admitting: *Deleted

## 2013-03-21 ENCOUNTER — Encounter: Payer: Self-pay | Admitting: Interventional Cardiology

## 2013-03-21 DIAGNOSIS — E7800 Pure hypercholesterolemia, unspecified: Secondary | ICD-10-CM | POA: Insufficient documentation

## 2013-03-21 DIAGNOSIS — K219 Gastro-esophageal reflux disease without esophagitis: Secondary | ICD-10-CM | POA: Insufficient documentation

## 2013-03-21 DIAGNOSIS — E785 Hyperlipidemia, unspecified: Secondary | ICD-10-CM | POA: Insufficient documentation

## 2013-03-21 DIAGNOSIS — R609 Edema, unspecified: Secondary | ICD-10-CM | POA: Insufficient documentation

## 2013-03-21 DIAGNOSIS — I10 Essential (primary) hypertension: Secondary | ICD-10-CM | POA: Insufficient documentation

## 2013-03-21 DIAGNOSIS — N529 Male erectile dysfunction, unspecified: Secondary | ICD-10-CM | POA: Insufficient documentation

## 2013-03-21 DIAGNOSIS — E669 Obesity, unspecified: Secondary | ICD-10-CM | POA: Insufficient documentation

## 2013-03-21 DIAGNOSIS — D649 Anemia, unspecified: Secondary | ICD-10-CM | POA: Insufficient documentation

## 2013-03-21 DIAGNOSIS — E538 Deficiency of other specified B group vitamins: Secondary | ICD-10-CM | POA: Insufficient documentation

## 2013-03-21 DIAGNOSIS — E782 Mixed hyperlipidemia: Secondary | ICD-10-CM | POA: Insufficient documentation

## 2013-03-21 DIAGNOSIS — R809 Proteinuria, unspecified: Secondary | ICD-10-CM | POA: Insufficient documentation

## 2013-03-21 DIAGNOSIS — B1920 Unspecified viral hepatitis C without hepatic coma: Secondary | ICD-10-CM | POA: Insufficient documentation

## 2013-03-24 ENCOUNTER — Ambulatory Visit: Payer: Medicare Other | Admitting: Interventional Cardiology

## 2013-04-07 ENCOUNTER — Other Ambulatory Visit: Payer: Self-pay | Admitting: Cardiology

## 2013-04-07 ENCOUNTER — Other Ambulatory Visit: Payer: Self-pay

## 2013-04-07 MED ORDER — ISOSORBIDE MONONITRATE ER 30 MG PO TB24: 30.0000 mg | ORAL_TABLET | Freq: Every day | ORAL | Status: DC

## 2013-06-26 ENCOUNTER — Telehealth: Payer: Self-pay | Admitting: *Deleted

## 2013-06-26 MED ORDER — ISOSORBIDE MONONITRATE ER 30 MG PO TB24: 30.0000 mg | ORAL_TABLET | Freq: Every day | ORAL | Status: DC

## 2013-06-26 NOTE — Telephone Encounter (Signed)
Refilled

## 2013-06-26 NOTE — Telephone Encounter (Signed)
Patient needs imdur refill to be sent to [facility] on [address]. Thanks, MI

## 2013-08-06 ENCOUNTER — Other Ambulatory Visit: Payer: Self-pay | Admitting: Cardiology

## 2013-08-06 MED ORDER — ISOSORBIDE MONONITRATE ER 30 MG PO TB24: 30.0000 mg | ORAL_TABLET | Freq: Every day | ORAL | Status: DC

## 2013-09-17 ENCOUNTER — Ambulatory Visit: Payer: Medicare Other | Admitting: Interventional Cardiology

## 2013-12-15 ENCOUNTER — Encounter: Payer: Self-pay | Admitting: Gastroenterology

## 2013-12-18 ENCOUNTER — Other Ambulatory Visit: Payer: Self-pay | Admitting: Endocrinology

## 2013-12-18 ENCOUNTER — Ambulatory Visit: Payer: Commercial Managed Care - HMO | Admitting: Endocrinology

## 2013-12-25 ENCOUNTER — Encounter: Payer: Self-pay | Admitting: Interventional Cardiology

## 2013-12-29 ENCOUNTER — Ambulatory Visit (INDEPENDENT_AMBULATORY_CARE_PROVIDER_SITE_OTHER): Payer: Commercial Managed Care - HMO | Admitting: Endocrinology

## 2013-12-29 ENCOUNTER — Encounter: Payer: Self-pay | Admitting: Endocrinology

## 2013-12-29 VITALS — BP 123/84 | HR 75 | Temp 98.2°F | Resp 16 | Ht 71.0 in | Wt 231.4 lb

## 2013-12-29 DIAGNOSIS — IMO0001 Reserved for inherently not codable concepts without codable children: Secondary | ICD-10-CM | POA: Insufficient documentation

## 2013-12-29 DIAGNOSIS — I10 Essential (primary) hypertension: Secondary | ICD-10-CM

## 2013-12-29 DIAGNOSIS — E1129 Type 2 diabetes mellitus with other diabetic kidney complication: Secondary | ICD-10-CM

## 2013-12-29 DIAGNOSIS — E1165 Type 2 diabetes mellitus with hyperglycemia: Principal | ICD-10-CM

## 2013-12-29 DIAGNOSIS — E1142 Type 2 diabetes mellitus with diabetic polyneuropathy: Secondary | ICD-10-CM

## 2013-12-29 MED ORDER — LIRAGLUTIDE 18 MG/3ML ~~LOC~~ SOPN: 1.8000 mg | PEN_INJECTOR | Freq: Every day | SUBCUTANEOUS | Status: DC

## 2013-12-29 NOTE — Patient Instructions (Addendum)
Please check blood sugars at least half the time about 2 hours after any meal and times per week on waking up. Please bring blood sugar monitor to each visit  Start VICTOZA injection with the pen once daily at the same time of the day.  Dial the dose to 0.6 mg for the first 3 days then 1.2 for 3 days and then 1.8mg .   You may possibly experience nausea in the first 1-2 days which usually gets better     You may inject in the stomach, thigh or arm.   You will feel fullness of the stomach with starting the medication and should try to keep portions of food small.  Call us or the [facility] helpline at [phone] or visit [email or web address] for any questions  Finish Actos then do not refill

## 2013-12-29 NOTE — Progress Notes (Signed)
Patient ID: Fernando Peterson, male   DOB: [birth year], 58 y.o.   MRN: [id number]    Reason for Appointment: Consultation for Type 2 Diabetes  Referring physician: NNODI  History of Present Illness:          Diagnosis: Type 2 diabetes mellitus, date of diagnosis: 1992        Past history: He was initially treated with oral agents such as Glucotrol, Glucophage, Glucovance and Actos In 2001 he was put on insulin because of poor control and apparently also had liver enzyme abnormalities Since then he has had difficulty controlling his diabetes except with high doses of insulin and adding agents such as Symlin In 2000 he was taking a total of 225 units of 3 types of insulin including NPH at bedtime because of significantly high fasting readings Not clear why Symlin was stopped  Recent history:  He has been seen by various physicians in the last couple of years and is using various regimens for his diabetes Review of his A1c records indicate that blood sugars have been mostly poorly controlled with A1c range 7.5-9.9 with only one reading below 8% which was in 08/2013 At that time he had just come off Victoza which he had taken for 6 months at 1.8 mg dose He was taking this in combination with only Lantus insulin along with Invokana His dose of Lantus has been about the same for some time except increase by 5 units last month He thinks that with Victoza blood sugars were generally been 100-130 range at various times Because of insurance not covering Victoza this year he has not taking this and his blood sugars have been progressively higher He continues to take [place] which was started a year ago but is not benefiting. Also has continued his previous regimen of metformin and 15 mg Actos He does not complain of unusual fatigue or excessive thirst although he does drink a lot of water  Oral hypoglycemic drugs the patient is taking are:   Invokana 300 mg, metformin 850 3 times a day and Actos      Side effects from medications have been: None INSULIN regimen is described as:   85 Lantus at bedtime Compliance with the medical regimen: Good  Hypoglycemia:   none  Glucose monitoring:  done 3 times a day         Glucometer: One Touch.      Blood Glucose readings by recall: Blood sugars are in the 230-250 range throughout the day without any pattern   Glycemic control: A1c was 9.9% on 12/11/13 9 urine microalbumin ratio in 3/15 was 339 but has been as high as 15,772   Lab Results  Component Value Date   HGBA1C  Value: 9.6 (NOTE)                                                                       According to the ADA Clinical Practice Recommendations for 2011, when HbA1c is used as a screening test:   >=6.5%   Diagnostic of Diabetes Mellitus           (if abnormal result  is confirmed)  5.7-6.4%   Increased risk of developing Diabetes Mellitus  References:Diagnosis and Classification of Diabetes Mellitus,Diabetes  VFIE,3329,51(OACZY 1):S62-S69 and Standards of Medical Care in         Diabetes - 2011,Diabetes [phone]  (Suppl 1):S11-S61.* 05/25/2010   Lab Results  Component Value Date   LDLCALC  Value: 83        Total Cholesterol/HDL:CHD Risk Coronary Heart Disease Risk Table                     Men   Women  1/2 Average Risk   3.4   3.3  Average Risk       5.0   4.4  2 X Average Risk   9.6   7.1  3 X Average Risk  23.4   11.0        Use the calculated Patient Ratio above and the CHD Risk Table to determine the patient's CHD Risk.        ATP III CLASSIFICATION (LDL):  <100     mg/dL   Optimal  100-129  mg/dL   Near or Above                    Optimal  130-159  mg/dL   Borderline  160-189  mg/dL   High  >190     mg/dL   Very High 05/25/2010   CREATININE 0.76 12/23/2010    Retinal exam: Most recent: 8/14, reportedly no neuropathy    Self-care: The diet that the patient has been following is: Usually balanced with proteins at each meal and low fat  Meals: 3 meals per day. Breakfast is  usually  boiled  eggs and oatmeal, has grilled chicken salad at lunch and baked chicken or steak at dinner          snacks or fruit, chips or crackers Exercise: Gym 3-4/7 30 minutes walking and weights also  Dietician visit: Most recent:? 2006              Weight history:   Wt Readings from Last 3 Encounters:  12/29/13 231 lb 6.4 oz (104.962 kg)  12/29/10 256 lb (116.121 kg)       Medication List       This list is accurate as of: 12/29/13 11:32 AM.  Always use your most recent med list.               carvedilol 12.5 MG tablet  Commonly known as:  COREG  Take 12.5 mg by mouth 2 (two) times daily with a meal.     colesevelam 625 MG tablet  Commonly known as:  WELCHOL  Take 1,875 mg by mouth 2 (two) times daily with a meal.     gabapentin 300 MG capsule  Commonly known as:  NEURONTIN  Take 1 capsule by mouth as directed.     insulin aspart 100 UNIT/ML injection  Commonly known as:  novoLOG  Inject 28 Units into the skin 3 (three) times daily before meals.     insulin glargine 100 UNIT/ML injection  Commonly known as:  LANTUS  Inject 80 Units into the skin at bedtime.     insulin NPH Human 100 UNIT/ML injection  Commonly known as:  HUMULIN N,NOVOLIN N  Inject 70 Units into the skin.     INVOKANA 300 MG Tabs  Generic drug:  Canagliflozin  as directed.     isosorbide mononitrate 30 MG 24 hr tablet  Commonly known as:  IMDUR  Take 1 tablet (30 mg total) by mouth daily.     losartan 100 MG  tablet  Commonly known as:  COZAAR  100 mg.     metFORMIN 850 MG tablet  Commonly known as:  GLUCOPHAGE  Take 850 mg by mouth 2 (two) times daily with a meal.     NEXIUM 40 MG capsule  Generic drug:  esomeprazole  Take 1 capsule by mouth daily.     PATADAY 0.2 % Soln  Generic drug:  Olopatadine HCl  as directed.     pioglitazone 15 MG tablet  Commonly known as:  ACTOS  Take 15 mg by mouth daily.     pramlintide 600 MCG/ML injection  Commonly known as:  SYMLIN   Inject 120 mcg into the skin 3 (three) times daily before meals.     simvastatin 10 MG tablet  Commonly known as:  ZOCOR  10 mg.     triamterene-hydrochlorothiazide 37.5-25 MG per tablet  Commonly known as:  MAXZIDE-25  Take 1 tablet by mouth daily.        Allergies:  Allergies  Allergen Reactions  . Penicillin G     Past Medical History  Diagnosis Date  . Hypertension   . Diabetes mellitus   . GERD (gastroesophageal reflux disease)   . Hepatitis C   . Edema   . Hypercholesteremia   . Vitamin B12 deficiency   . Dyslipidemia   . Proteinuria   . Mixed hyperlipidemia   . Obesity   . Erectile dysfunction   . Anemia     Past Surgical History  Procedure Laterality Date  . Cervical disc surgery    . Penile prosthesis implant    . Mandible fracture surgery      Family History  Problem Relation Age of Onset  . Diabetes Mother   . Heart disease Mother   . Heart disease Sister   . Diabetes Brother   . Heart disease Brother     Social History:  reports that he has quit smoking. He does not have any smokeless tobacco history on file. He reports that he uses illicit drugs (Heroin). He reports that he does not drink alcohol.    Review of Systems   Eyes film water      Lipids: He has been only on low dose simvastatin along the WelChol His last lipids showed LDL 60, HDL 55 and triglycerides 90       Lab Results  Component Value Date   CHOL  Value: 153        ATP III CLASSIFICATION:  <200     mg/dL   Desirable  200-239  mg/dL   Borderline High  >=240    mg/dL   High        05/25/2010   HDL 45 05/25/2010   LDLCALC  Value: 83        Total Cholesterol/HDL:CHD Risk Coronary Heart Disease Risk Table                     Men   Women  1/2 Average Risk   3.4   3.3  Average Risk       5.0   4.4  2 X Average Risk   9.6   7.1  3 X Average Risk  23.4   11.0        Use the calculated Patient Ratio above and the CHD Risk Table to determine the patient's CHD Risk.        ATP III  CLASSIFICATION (LDL):  <100     mg/dL   Optimal  100-129  mg/dL   Near or Above                    Optimal  130-159  mg/dL   Borderline  160-189  mg/dL   High  >190     mg/dL   Very High 05/25/2010   TRIG 127 05/25/2010   CHOLHDL 3.4 05/25/2010                  Skin: No rash or infections     Thyroid:  No  unusual fatigue.     The blood pressure has been treated with multiple medications for years     No swelling of feet. Does take Maxzide     No shortness of breath or chest tightness  on exertion.     Bowel habits: Normal.       No frequency of urination and has nocturia 1-2 times      No joint  pains.          He has a long history of  Numbness, sharp pains and burning in feet treated with Neurontin    LABS:  Most recent creatinine was 0.95 and 7/50 and ALT/AST 44, 38 in 08/2013, previously as high as 87/85  Physical Examination:  BP 123/84  Pulse 75  Temp(Src) 98.2 F (36.8 C)  Resp 16  Ht 5\' 11"  (1.803 m)  Wt 231 lb 6.4 oz (104.962 kg)  BMI 32.29 kg/m2  SpO2 96%  GENERAL:         Patient has mild generalized obesity.   HEENT:         Eye exam shows normal external appearance. Fundus exam shows no retinopathy. Oral exam shows normal mucosa .  NECK:         General:  Neck exam shows no lymphadenopathy. Carotids are normal to palpation and no bruit heard.  Thyroid is not enlarged and no nodules felt.   LUNGS:         Chest is symmetrical. Lungs are clear to auscultation.Marland Kitchen   HEART:         Heart sounds:  S1 and S2 are normal. No murmurs or clicks heard., no S3 or S4.   ABDOMEN:   There is no distention present. Liver and spleen are not palpable. No other mass or tenderness present.  EXTREMITIES:     There is no edema. No skin lesions present.Marland Kitchen  NEUROLOGICAL:   Vibration sense is moderately reduced in toes. Ankle jerks are absent bilaterally.biceps reflexes are normal.          Diabetic foot exam:  Monofilament sensation absent except mildly present on the right 1st 2  toes Pedal pulses normal MUSCULOSKELETAL:       There is no enlargement or deformity of the joints. Spine is normal to inspection.Marland Kitchen   SKIN:       No rash or lesions of concern.        ASSESSMENT:  Diabetes type 2, uncontrolled with only in mild obesity  However by history he has been significantly insulin resistant requiring large doses of insulin for control Currently not benefiting much from metformin high dose and also Invokana and Actos Overall his compliance with diet and exercise appears excellent and he has lost weight from his previous levels He had previously benefited from drugs like Symlin and Victoza He reports that he had relatively normal blood sugars with Victoza and his A1c was down to 7.5% earlier this year Currently blood sugars  are well over 200 and he needs further management of hyperglycemia  Complications: Peripheral neuropathy, nephropathy with proteinuria  History of abnormal liver functions: He probably does have significant hepatic steatosis from insulin resistance since his liver functions were better in 3/15 when his glucose was well controlled However he has had hepatitis C and he will need to followup for evaluation of this  Hyperlipidemia: This apparently has been mild and well controlled with low dose Zocor and WelChol. Presumably on low dose Zocor only because of previous liver function abnormalities  Hypertension: Appears well controlled  PLAN:   He will be restarted on Victoza and prior authorization will be obtained if needed as this would be the most effective treatment for him  He will bring his glucose monitor for review on the next visit  Also discussed checking blood sugars about 2 hours after meals which he has not been doing  Continue exercise regimen  He can continue his metformin and Invokana but not clear if he is benefiting from Actos and can discontinue this  Followup in 4 weeks  For his hyperlipidemia consider using a higher dose  simvastatin along without WelChol since his last liver functions were normal  Total visit time including counseling = 60 minutes  Fernando Peterson 12/29/2013, 11:32 AM   Note: This office note was prepared with Estate agent. Any transcriptional errors that result from this process are unintentional.

## 2014-01-06 ENCOUNTER — Ambulatory Visit (INDEPENDENT_AMBULATORY_CARE_PROVIDER_SITE_OTHER): Payer: Commercial Managed Care - HMO | Admitting: Physician Assistant

## 2014-01-06 ENCOUNTER — Encounter: Payer: Self-pay | Admitting: Physician Assistant

## 2014-01-06 VITALS — BP 120/78 | HR 90 | Ht 71.0 in | Wt 231.8 lb

## 2014-01-06 DIAGNOSIS — R079 Chest pain, unspecified: Secondary | ICD-10-CM

## 2014-01-06 DIAGNOSIS — E7800 Pure hypercholesterolemia, unspecified: Secondary | ICD-10-CM

## 2014-01-06 DIAGNOSIS — I10 Essential (primary) hypertension: Secondary | ICD-10-CM

## 2014-01-06 DIAGNOSIS — E782 Mixed hyperlipidemia: Secondary | ICD-10-CM

## 2014-01-06 NOTE — Assessment & Plan Note (Signed)
Check fasting lipid panel and LFTs 

## 2014-01-06 NOTE — Patient Instructions (Addendum)
Your physician recommends that you continue on your current medications as directed. Please refer to the Current Medication list given to you today.  Your physician recommends that you return for a FASTING lipid profile and hepatic on  Your physician wants you to follow-up in: 6 months with Dr. Irish Lack. You will receive a reminder letter in the mail two months in advance. If you don't receive a letter, please call our office to schedule the follow-up appointment.

## 2014-01-06 NOTE — Assessment & Plan Note (Signed)
Controlled.  

## 2014-01-06 NOTE — Assessment & Plan Note (Signed)
Patient has atypical chest pain. He has a lot of GI symptoms going on. He does have an abnormal stress Myoview in 09/2012 showing mild to moderate ischemia in the mid inferior and apical inferior regions EF 64%. Medical management was recommended. I don't feel like his current symptoms are cardiac related. He does have a lot of GI symptoms. Continue medical management. Followup with Dr.Varanasi in 6 months or sooner if needed.

## 2014-01-06 NOTE — Progress Notes (Signed)
HPI: This is a 58 year old male patient Dr. Irish Lack  And Dr. Orland Penman who has history of chest pain and abnormal myocardial perfusion scan that shows mild to moderate ischemia in the mid inferior and apical inferior regions with normal LV function EF 64%. This was done in 09/2012. He has been treated medically because of atypical chest pain. He also has hypertension, hyperlipidemia, and diabetes mellitus.  The patient comes in today for a checkup. He says he has occasional sharp shooting chest pains. Feels like a quick stab in his left chest that goes away instantly. This usually happens when he sitting down and then he can go weeks without having it. He also has a lot of GI symptoms including reflux, excessive belching, and a gallbladder that needs to come out. He is scheduled for colonoscopy this month and complains of foul-smelling stools. He goes to the gym once a week and walks on the treadmill for 20 minutes without symptoms. He denies any exertional chest tightness, pressure, dyspnea, dyspnea on exertion, palpitations, or presyncope. He occasionally has dizziness and off balance but this has not been a problem recently.  Allergies -- Penicillin G   Current Outpatient Prescriptions on File Prior to Visit: carvedilol (COREG) 12.5 MG tablet, Take 12.5 mg by mouth 2 (two) times daily with a meal.  , Disp: , Rfl:  colesevelam (WELCHOL) 625 MG tablet, Take 1,875 mg by mouth 2 (two) times daily with a meal.  , Disp: , Rfl:  gabapentin (NEURONTIN) 300 MG capsule, Take 1 capsule by mouth as directed., Disp: , Rfl:  insulin glargine (LANTUS) 100 UNIT/ML injection, Inject 80 Units into the skin at bedtime. , Disp: , Rfl:  INVOKANA 300 MG TABS, as directed., Disp: , Rfl:  Liraglutide (VICTOZA) 18 MG/3ML SOPN, Inject 1.8 mg into the skin daily., Disp: 3 pen, Rfl: 2 losartan (COZAAR) 100 MG tablet, 100 mg., Disp: , Rfl:  metFORMIN (GLUCOPHAGE) 850 MG tablet, Take 850 mg by mouth 3 (three) times daily with meals.  , Disp: , Rfl:  NEXIUM 40 MG capsule, Take 1 capsule by mouth daily., Disp: , Rfl:  PATADAY 0.2 % SOLN, as directed., Disp: , Rfl:  pioglitazone (ACTOS) 15 MG tablet, Take 15 mg by mouth daily.  , Disp: , Rfl:  simvastatin (ZOCOR) 10 MG tablet, 10 mg., Disp: , Rfl:  triamterene-hydrochlorothiazide (MAXZIDE-25) 37.5-25 MG per tablet, Take 1 tablet by mouth daily.  , Disp: , Rfl:   No current facility-administered medications on file prior to visit.   Past Medical History:   Hypertension                                                 Diabetes mellitus                                            GERD (gastroesophageal reflux disease)                       Hepatitis C  Edema                                                        Hypercholesteremia                                           Vitamin B12 deficiency                                       Dyslipidemia                                                 Proteinuria                                                  Mixed hyperlipidemia                                         Obesity                                                      Erectile dysfunction                                         Anemia                                                      Past Surgical History:   CERVICAL DISC SURGERY                                         PENILE PROSTHESIS IMPLANT                                     MANDIBLE FRACTURE SURGERY                                    Review of patient's family history indicates:   Diabetes                       Mother  Heart disease                  Mother                   Heart disease                  Sister                   Diabetes                       Brother                  Heart disease                  Brother                  Social History   Marital Status: Married             Spouse Name:                      Years of  Education:                 Number of children:             Occupational History   None on file  Social History Main Topics   Smoking Status: Former Smoker                   Packs/Day: 1.50  Years:         Smokeless Status: Not on file                      Comment: Quit in 1992   Alcohol Use: No             Drug Use: No                Comment: Pt states he is not using any drugs   Sexual Activity: Not on file        Other Topics            Concern   None on file  Social History Narrative   None on file    ROS: See history of present illness otherwise negative   PHYSICAL EXAM: Obese, in no acute distress. Neck: No JVD, HJR, Bruit, or thyroid enlargement  Lungs: No tachypnea, clear without wheezing, rales, or rhonchi  Cardiovascular: RRR, PMI not displaced, heart sounds normal, no murmurs, gallops, bruit, thrill, or heave.  Abdomen: BS normal. Soft without organomegaly, masses, lesions or tenderness.  Extremities: without cyanosis, clubbing or edema. Good distal pulses bilateral  SKin: Warm, no lesions or rashes   Musculoskeletal: No deformities  Neuro: no focal signs  BP 120/78  Pulse 90  Ht 5\' 11"  (1.803 m)  Wt 231 lb 12.8 oz (105.144 kg)  BMI 32.34 kg/m2   EKG: Normal sinus rhythm at 88 beats per minute with ST elevation consistent with early repolarization, no change from prior tracings

## 2014-01-07 ENCOUNTER — Other Ambulatory Visit (INDEPENDENT_AMBULATORY_CARE_PROVIDER_SITE_OTHER): Payer: Commercial Managed Care - HMO

## 2014-01-07 DIAGNOSIS — E782 Mixed hyperlipidemia: Secondary | ICD-10-CM

## 2014-01-07 LAB — HEPATIC FUNCTION PANEL
ALBUMIN: 3.6 g/dL (ref 3.5–5.2)
ALT: 90 U/L — ABNORMAL HIGH (ref 0–53)
AST: 96 U/L — AB (ref 0–37)
Alkaline Phosphatase: 45 U/L (ref 39–117)
BILIRUBIN TOTAL: 0.8 mg/dL (ref 0.2–1.2)
Bilirubin, Direct: 0.3 mg/dL (ref 0.0–0.3)
Total Protein: 7.2 g/dL (ref 6.0–8.3)

## 2014-01-07 LAB — LIPID PANEL
CHOL/HDL RATIO: 3
Cholesterol: 114 mg/dL (ref 0–200)
HDL: 36.2 mg/dL — AB (ref >39.00)
LDL CALC: 45 mg/dL (ref 0–99)
NONHDL: 77.8
Triglycerides: 164 mg/dL — ABNORMAL HIGH (ref 0.0–149.0)
VLDL: 32.8 mg/dL (ref 0.0–40.0)

## 2014-01-08 ENCOUNTER — Telehealth: Payer: Self-pay | Admitting: Endocrinology

## 2014-01-08 ENCOUNTER — Other Ambulatory Visit: Payer: Self-pay | Admitting: *Deleted

## 2014-01-08 MED ORDER — INSULIN GLARGINE 100 UNIT/ML ~~LOC~~ SOLN: 80.0000 [IU] | Freq: Every day | SUBCUTANEOUS | Status: DC

## 2014-01-08 NOTE — Telephone Encounter (Signed)
Patient need refill of Lantus [facility] on [address]

## 2014-01-08 NOTE — Telephone Encounter (Signed)
Patient need refill of Lantus [facility] on [address]

## 2014-01-08 NOTE — Telephone Encounter (Signed)
rx sent

## 2014-01-11 ENCOUNTER — Encounter (HOSPITAL_COMMUNITY): Payer: Self-pay | Admitting: Emergency Medicine

## 2014-01-11 ENCOUNTER — Emergency Department (HOSPITAL_COMMUNITY)
Admission: EM | Admit: 2014-01-11 | Discharge: 2014-01-12 | Disposition: A | Discharge status: Home / Self Care | Payer: Medicare HMO | Attending: Emergency Medicine | Admitting: Emergency Medicine

## 2014-01-11 DIAGNOSIS — E782 Mixed hyperlipidemia: Secondary | ICD-10-CM | POA: Diagnosis not present

## 2014-01-11 DIAGNOSIS — Z79899 Other long term (current) drug therapy: Secondary | ICD-10-CM | POA: Diagnosis not present

## 2014-01-11 DIAGNOSIS — Z862 Personal history of diseases of the blood and blood-forming organs and certain disorders involving the immune mechanism: Secondary | ICD-10-CM | POA: Insufficient documentation

## 2014-01-11 DIAGNOSIS — E669 Obesity, unspecified: Secondary | ICD-10-CM | POA: Insufficient documentation

## 2014-01-11 DIAGNOSIS — E1142 Type 2 diabetes mellitus with diabetic polyneuropathy: Secondary | ICD-10-CM | POA: Diagnosis not present

## 2014-01-11 DIAGNOSIS — Z880 Allergy status to penicillin: Secondary | ICD-10-CM | POA: Diagnosis not present

## 2014-01-11 DIAGNOSIS — M7989 Other specified soft tissue disorders: Secondary | ICD-10-CM | POA: Insufficient documentation

## 2014-01-11 DIAGNOSIS — Z794 Long term (current) use of insulin: Secondary | ICD-10-CM | POA: Insufficient documentation

## 2014-01-11 DIAGNOSIS — Z87891 Personal history of nicotine dependence: Secondary | ICD-10-CM | POA: Diagnosis not present

## 2014-01-11 DIAGNOSIS — I10 Essential (primary) hypertension: Secondary | ICD-10-CM | POA: Diagnosis not present

## 2014-01-11 DIAGNOSIS — E1149 Type 2 diabetes mellitus with other diabetic neurological complication: Secondary | ICD-10-CM | POA: Diagnosis not present

## 2014-01-11 DIAGNOSIS — Z8619 Personal history of other infectious and parasitic diseases: Secondary | ICD-10-CM | POA: Insufficient documentation

## 2014-01-11 LAB — CBG MONITORING, ED: GLUCOSE-CAPILLARY: 141 mg/dL — AB (ref 70–99)

## 2014-01-11 NOTE — ED Provider Notes (Signed)
CSN: [id number]     Arrival date & time 01/11/14  2026 History   First MD Initiated Contact with Patient 01/11/14 2302     Chief Complaint  Patient presents with  . Leg Swelling    The history is provided by the patient.  Yesterday he noticed swelling in his legs.  He has not had this problem before.  The legs are also aching.  The swelling is in both ankles.  The pain is more in his shins.  No recent injuries.  No falls.  No chest pain or shortness of breath.  No fevers.  The skin seems very sensitive.  Light touch makes it worse.  Recent medication changes was adding victoza a couple of weeks ago. No history of heart failure.  No DVT history. Past Medical History  Diagnosis Date  . Hypertension   . Diabetes mellitus   . GERD (gastroesophageal reflux disease)   . Hepatitis C   . Edema   . Hypercholesteremia   . Vitamin B12 deficiency   . Dyslipidemia   . Proteinuria   . Mixed hyperlipidemia   . Obesity   . Erectile dysfunction   . Anemia    Past Surgical History  Procedure Laterality Date  . Cervical disc surgery    . Penile prosthesis implant    . Mandible fracture surgery     Family History  Problem Relation Age of Onset  . Diabetes Mother   . Heart disease Mother   . Heart disease Sister   . Diabetes Brother   . Heart disease Brother    History  Substance Use Topics  . Smoking status: Former Smoker -- 1.50 packs/day  . Smokeless tobacco: Not on file     Comment: Quit in 1992  . Alcohol Use: No    Review of Systems  All other systems reviewed and are negative.     Allergies  Penicillin g  Home Medications   Prior to Admission medications   Medication Sig Start Date End Date Taking? Authorizing Provider  carvedilol (COREG) 12.5 MG tablet Take 12.5 mg by mouth daily.    Yes Historical Provider, MD  colesevelam (WELCHOL) 625 MG tablet Take 1,875 mg by mouth 2 (two) times daily with a meal.     Yes Historical Provider, MD  gabapentin (NEURONTIN) 300 MG  capsule Take 900 mg by mouth 2 (two) times daily.  12/14/13  Yes Historical Provider, MD  insulin glargine (LANTUS) 100 UNIT/ML injection Inject 0.8 mLs (80 Units total) into the skin at bedtime. 01/08/14  Yes Elayne Snare, MD  INVOKANA 300 MG TABS Take 300 mg by mouth daily.  11/16/13  Yes Historical Provider, MD  ketoconazole (NIZORAL) 2 % shampoo Apply 1 application topically every other day. 12/01/13  Yes Historical Provider, MD  Liraglutide (VICTOZA) 18 MG/3ML SOPN Inject 1.8 mg into the skin daily. 12/29/13  Yes Elayne Snare, MD  losartan (COZAAR) 100 MG tablet Take 100 mg by mouth daily.  12/11/13  Yes Historical Provider, MD  metFORMIN (GLUCOPHAGE) 850 MG tablet Take 850 mg by mouth 3 (three) times daily with meals.    Yes Historical Provider, MD  NEXIUM 40 MG capsule Take 1 capsule by mouth daily. 12/11/13  Yes Historical Provider, MD  PATADAY 0.2 % SOLN Place 1 drop into both eyes daily.  12/07/13  Yes Historical Provider, MD  pioglitazone (ACTOS) 15 MG tablet Take 15 mg by mouth daily.    Yes Historical Provider, MD  simvastatin (ZOCOR) 10 MG  tablet Take 10 mg by mouth daily.  12/11/13  Yes Historical Provider, MD  triamterene-hydrochlorothiazide (MAXZIDE-25) 37.5-25 MG per tablet Take 1 tablet by mouth daily.     Yes Historical Provider, MD   BP 121/71  Pulse 98  Temp(Src) 98.9 F (37.2 C) (Oral)  Resp 18  SpO2 99% Physical Exam  Nursing note and vitals reviewed. Constitutional: He appears well-developed and well-nourished. No distress.  HENT:  Head: Normocephalic and atraumatic.  Right Ear: External ear normal.  Left Ear: External ear normal.  Eyes: Conjunctivae are normal. Right eye exhibits no discharge. Left eye exhibits no discharge. No scleral icterus.  Neck: Neck supple. No tracheal deviation present.  Cardiovascular: Normal rate, regular rhythm and intact distal pulses.   Pulmonary/Chest: Effort normal and breath sounds normal. No stridor. No respiratory distress. He has no wheezes.  He has no rales.  Abdominal: Soft. Bowel sounds are normal. He exhibits no distension. There is no tenderness. There is no rebound and no guarding.  Musculoskeletal:  Mild edema around the ankle, no erythema, no foot or calf edema  Neurological: He is alert. He has normal strength. No cranial nerve deficit (no facial droop, extraocular movements intact, no slurred speech) or sensory deficit. He exhibits normal muscle tone. He displays no seizure activity. Coordination normal.  Skin is hyperesthetic bilateral ankles and calves   Skin: Skin is warm and dry. No rash noted.  Psychiatric: He has a normal mood and affect.    ED Course  Procedures (including critical care time) Labs Review Labs Reviewed  CBC WITH DIFFERENTIAL - Abnormal; Notable for the following:    WBC 3.5 (*)    RBC 3.91 (*)    Hemoglobin 11.7 (*)    HCT 33.3 (*)    Platelets 60 (*)    All other components within normal limits  BASIC METABOLIC PANEL - Abnormal; Notable for the following:    Glucose, Bld 180 (*)    GFR calc non Af Amer 80 (*)    All other components within normal limits  CBG MONITORING, ED - Abnormal; Notable for the following:    Glucose-Capillary 141 (*)    All other components within normal limits  CK     MDM   Final diagnoses:  Diabetic polyneuropathy associated with type 2 diabetes mellitus    Pt does not have any findings on exam to suggest infection.  No swelling to suggest DVT.  Symptoms are bilateral.  I suspect neuropathy is the cause.  Follow up with PCP.  Already on neurontin.  May be able to increase dose.    Dorie Rank, MD 01/12/14 [phone]

## 2014-01-11 NOTE — ED Notes (Signed)
Pt states he noticed bilateral leg swelling and pain in both legs yesterday, he also complains of the left ankle being swollen

## 2014-01-12 DIAGNOSIS — E1149 Type 2 diabetes mellitus with other diabetic neurological complication: Secondary | ICD-10-CM | POA: Diagnosis not present

## 2014-01-12 LAB — BASIC METABOLIC PANEL
Anion gap: 13 (ref 5–15)
BUN: 16 mg/dL (ref 6–23)
CALCIUM: 9.3 mg/dL (ref 8.4–10.5)
CHLORIDE: 97 meq/L (ref 96–112)
CO2: 27 meq/L (ref 19–32)
CREATININE: 1.01 mg/dL (ref 0.50–1.35)
GFR calc Af Amer: >90 mL/min (ref >90)
GFR, EST NON AFRICAN AMERICAN: 80 mL/min — AB (ref >90)
GLUCOSE: 180 mg/dL — AB (ref 70–99)
POTASSIUM: 4.6 meq/L (ref 3.7–5.3)
Sodium: 137 mEq/L (ref 137–147)

## 2014-01-12 LAB — CBC WITH DIFFERENTIAL/PLATELET
BASOS ABS: 0 10*3/uL (ref 0.0–0.1)
BASOS PCT: 0 % (ref 0–1)
Eosinophils Absolute: 0.1 10*3/uL (ref 0.0–0.7)
Eosinophils Relative: 1 % (ref 0–5)
HEMATOCRIT: 33.3 % — AB (ref 39.0–52.0)
HEMOGLOBIN: 11.7 g/dL — AB (ref 13.0–17.0)
LYMPHS PCT: 34 % (ref 12–46)
Lymphs Abs: 1.2 10*3/uL (ref 0.7–4.0)
MCH: 29.9 pg (ref 26.0–34.0)
MCHC: 35.1 g/dL (ref 30.0–36.0)
MCV: 85.2 fL (ref 78.0–100.0)
MONOS PCT: 8 % (ref 3–12)
Monocytes Absolute: 0.3 10*3/uL (ref 0.1–1.0)
NEUTROS ABS: 2 10*3/uL (ref 1.7–7.7)
NEUTROS PCT: 56 % (ref 43–77)
Platelets: 60 10*3/uL — ABNORMAL LOW (ref 150–400)
RBC: 3.91 MIL/uL — ABNORMAL LOW (ref 4.22–5.81)
RDW: 12.9 % (ref 11.5–15.5)
WBC: 3.5 10*3/uL — AB (ref 4.0–10.5)

## 2014-01-12 LAB — CK: Total CK: 189 U/L (ref 7–232)

## 2014-01-12 NOTE — Discharge Instructions (Signed)
Diabetic Neuropathy Diabetic neuropathy is a nerve disease or nerve damage that is caused by diabetes mellitus. About half of all people with diabetes mellitus have some form of nerve damage. Nerve damage is more common in those who have had diabetes mellitus for many years and who generally have not had good control of their blood sugar (glucose) level. Diabetic neuropathy is a common complication of diabetes mellitus. There are three more common types of diabetic neuropathy and a fourth type that is less common and less understood:   Peripheral neuropathy--This is the most common type of diabetic neuropathy. It causes damage to the nerves of the feet and legs first and then eventually the hands and arms.The damage affects the ability to sense touch.  Autonomic neuropathy--This type causes damage to the autonomic nervous system, which controls the following functions:  Heartbeat.  Body temperature.  Blood pressure.  Urination.  Digestion.  Sweating.  Sexual function.  Focal neuropathy--Focal neuropathy can be painful and unpredictable and occurs most often in older adults with diabetes mellitus. It involves a specific nerve or one area and often comes on suddenly. It usually does not cause long-term problems.  Radiculoplexus neuropathy-- Sometimes called lumbosacral radiculoplexus neuropathy, radiculoplexus neuropathy affects the nerves of the thighs, hips, buttocks, or legs. It is more common in people with type 2 diabetes mellitus and in older men. It is characterized by debilitating pain, weakness, and atrophy, usually in the thigh muscles. CAUSES  The cause of peripheral, autonomic, and focal neuropathies is diabetes mellitus that is uncontrolled and high glucose levels. The cause of radiculoplexus neuropathy is unknown. However, it is thought to be caused by inflammation related to uncontrolled glucose levels. SIGNS AND SYMPTOMS  Peripheral Neuropathy Peripheral neuropathy develops  slowly over time. When the nerves of the feet and legs no longer work there may be:   Burning, stabbing, or aching pain in the legs or feet.  Inability to feel pressure or pain in your feet. This can lead to:  Thick calluses over pressure areas.  Pressure sores.  Ulcers.  Foot deformities.  Reduced ability to feel temperature changes.  Muscle weakness. Autonomic Neuropathy The symptoms of autonomic neuropathy vary depending on which nerves are affected. Symptoms may include:  Problems with digestion, such as:  Feeling sick to your stomach (nausea).  Vomiting.  Bloating.  Constipation.  Diarrhea.  Abdominal pain.  Difficulty with urination. This occurs if you lose your ability to sense when your bladder is full. Problems include:  Urine leakage (incontinence).  Inability to empty your bladder completely (retention).  Rapid or irregular heartbeat (palpitations).  Blood pressure drops when you stand up (orthostatic hypotension). When you stand up you may feel:  Dizzy.  Weak.  Faint.  In men, inability to attain and maintain an erection.  In women, vaginal dryness and problems with decreased sexual desire and arousal.  Problems with body temperature regulation.  Increased or decreased sweating. Focal Neuropathy  Abnormal eye movements or abnormal alignment of both eyes.  Weakness in the wrist.  Foot drop. This results in an inability to lift the foot properly and abnormal walking or foot movement.  Paralysis on one side of your face (Bell palsy).  Chest or abdominal pain. Radiculoplexus Neuropathy  Sudden, severe pain in your hip, thigh, or buttocks.  Weakness and wasting of thigh muscles.  Difficulty rising from a seated position.  Abdominal swelling.  Unexplained weight loss (usually more than 10 lb [4.5 kg]). DIAGNOSIS  Peripheral Neuropathy Your senses may   be tested. Sensory function testing can be done with:  A light touch using a  monofilament.  A vibration with tuning fork.  A sharp sensation with a pin prick. Other tests that can help diagnose neuropathy are:  Nerve conduction velocity. This test checks the transmission of an electrical current through a nerve.  Electromyography. This shows how muscles respond to electrical signals transmitted by nearby nerves.  Quantitative sensory testing. This is used to assess how your nerves respond to vibrations and changes in temperature. Autonomic Neuropathy Diagnosis is often based on reported symptoms. Tell your health care provider if you experience:   Dizziness.   Constipation.   Diarrhea.   Inappropriate urination or inability to urinate.   Inability to get or maintain an erection.  Tests that may be done include:   Electrocardiography or Holter monitor. These are tests that can help show problems with the heart rate or heart rhythm.   An X-ray exam may be done. Focal Neuropathy Diagnosis is made based on your symptoms and what your health care provider finds during your exam. Other tests may be done. They may include:  Nerve conduction velocities. This checks the transmission of electrical current through a nerve.  Electromyography. This shows how muscles respond to electrical signals transmitted by nearby nerves.  Quantitative sensory testing. This test is used to assess how your nerves respond to vibration and changes in temperature. Radiculoplexus Neuropathy  Often the first thing is to eliminate any other issue or problems that might be the cause, as there is no stick test for diagnosis.  X-ray exam of your spine and lumbar region.  Spinal tap to rule out cancer.  MRI to rule out other lesions. TREATMENT  Once nerve damage occurs, it cannot be reversed. The goal of treatment is to keep the disease or nerve damage from getting worse and affecting more nerve fibers. Controlling your blood glucose level is the key. Most people with  radiculoplexus neuropathy see at least a partial improvement over time. You will need to keep your blood glucose and HbA1c levels in the target range determined by your health care provider. Things that help control blood glucose levels include:   Blood glucose monitoring.   Meal planning.   Physical activity.   Diabetes medicine.  Over time, maintaining lower blood glucose levels helps lessen symptoms. Sometimes, prescription pain medicine is needed. HOME CARE INSTRUCTIONS:  Do not smoke.  Keep your blood glucose level in the range that you and your health care provider have determined acceptable for you.  Keep your blood pressure level in the range that you and your health care provider have determined acceptable for you.  Eat a well-balanced diet.  Be active every day.  Check your feet every day. SEEK MEDICAL CARE IF:   You have burning, stabbing, or aching pain in the legs or feet.  You are unable to feel pressure or pain in your feet.  You develop problems with digestion such as:  Nausea.  Vomiting.  Bloating.  Constipation.  Diarrhea.  Abdominal pain.  You have difficulty with urination, such as:  Incontinence.  Retention.  You have palpitations.  You develop orthostatic hypotension. When you stand up you may feel:  Dizzy.  Weak.  Faint.  You cannot attain and maintain an erection (in men).  You have vaginal dryness and problems with decreased sexual desire and arousal (in women).  You have severe pain in your thighs, legs, or buttocks.  You have unexplained weight loss.   Document Released: 07/30/2001 Document Revised: 03/11/2013 Document Reviewed: 10/30/2012 [employer] Patient Information 2015 [employer]. This information is not intended to replace advice given to you by your health care provider. Make sure you discuss any questions you have with your health care provider. 

## 2014-01-13 ENCOUNTER — Telehealth: Payer: Self-pay | Admitting: *Deleted

## 2014-01-13 ENCOUNTER — Other Ambulatory Visit: Payer: Commercial Managed Care - HMO

## 2014-01-13 ENCOUNTER — Other Ambulatory Visit (INDEPENDENT_AMBULATORY_CARE_PROVIDER_SITE_OTHER): Payer: Commercial Managed Care - HMO

## 2014-01-13 DIAGNOSIS — IMO0001 Reserved for inherently not codable concepts without codable children: Secondary | ICD-10-CM

## 2014-01-13 DIAGNOSIS — E1165 Type 2 diabetes mellitus with hyperglycemia: Principal | ICD-10-CM

## 2014-01-13 LAB — COMPREHENSIVE METABOLIC PANEL
ALK PHOS: 47 U/L (ref 39–117)
ALT: 71 U/L — ABNORMAL HIGH (ref 0–53)
AST: 63 U/L — ABNORMAL HIGH (ref 0–37)
Albumin: 3.7 g/dL (ref 3.5–5.2)
BUN: 17 mg/dL (ref 6–23)
CHLORIDE: 103 meq/L (ref 96–112)
CO2: 24 meq/L (ref 19–32)
Calcium: 9.1 mg/dL (ref 8.4–10.5)
Creatinine, Ser: 1.1 mg/dL (ref 0.4–1.5)
GFR: 92.16 mL/min (ref >60.00)
GLUCOSE: 129 mg/dL — AB (ref 70–99)
POTASSIUM: 4.9 meq/L (ref 3.5–5.1)
SODIUM: 137 meq/L (ref 135–145)
TOTAL PROTEIN: 7.6 g/dL (ref 6.0–8.3)
Total Bilirubin: 1 mg/dL (ref 0.2–1.2)

## 2014-01-13 NOTE — Telephone Encounter (Signed)
Records release  faxed today

## 2014-01-13 NOTE — Telephone Encounter (Signed)
Pt scheduled for direct screening colonoscopy 01/27/14 with Dr. Deatra Ina.  Pt arrived for [facility] and says he had colonoscopy at [facility] in 2007 or 2008.  Pt does not know results of colonoscopy or have reports.  Release of information form signed and given to Genella Mech, CMA.  Pt says that he is going to have cholecystectomy and oral surgery soon and would like to have colonoscopy after these surgeries.  Previsit cancelled because scheduling colon will be more than 2 months away and Colonoscopy for 8/26 cancelled.

## 2014-01-15 LAB — FRUCTOSAMINE: Fructosamine: 398 umol/L — ABNORMAL HIGH (ref 190–270)

## 2014-01-15 NOTE — Telephone Encounter (Signed)
No records have come in yet

## 2014-01-18 ENCOUNTER — Ambulatory Visit (INDEPENDENT_AMBULATORY_CARE_PROVIDER_SITE_OTHER): Payer: Commercial Managed Care - HMO | Admitting: Endocrinology

## 2014-01-18 ENCOUNTER — Encounter: Payer: Self-pay | Admitting: Endocrinology

## 2014-01-18 VITALS — BP 108/68 | HR 87 | Temp 97.8°F | Resp 16 | Ht 71.0 in | Wt 232.4 lb

## 2014-01-18 DIAGNOSIS — IMO0001 Reserved for inherently not codable concepts without codable children: Secondary | ICD-10-CM

## 2014-01-18 DIAGNOSIS — E1165 Type 2 diabetes mellitus with hyperglycemia: Principal | ICD-10-CM

## 2014-01-18 NOTE — Progress Notes (Signed)
Patient ID: Fernando Peterson, male   DOB: Nov 07, 1955, 58 y.o.   MRN: [id number]    Reason for Appointment: Followup for Type 2 Diabetes  Referring physician: NNODI  History of Present Illness:          Diagnosis: Type 2 diabetes mellitus, date of diagnosis: 1992        Past history: He was initially treated with oral agents such as Glucotrol, Glucophage, Glucovance and Actos In 2001 he was put on insulin because of poor control and apparently also had liver enzyme abnormalities Since then he has had difficulty controlling his diabetes except with high doses of insulin and adding agents such as Symlin In 2000 he was taking a total of 225 units of 3 types of insulin including NPH at bedtime because of significantly high fasting readings He has been seen by various physicians in the last couple of years and is using various regimens for his diabetes Review of his A1c records indicate that blood sugars have been mostly poorly controlled with A1c range 7.5-9.9 with only one reading below 8% which was in 08/2013 At that time he had just come off Victoza which he had taken for 6 months at 1.8 mg dose He thinks that with Victoza blood sugars were generally been 100-130 range at various times He was taking this in combination with only Lantus insulin along with Invokana  Recent history:  Because of poor control and glucose readings consistently over 200 he was seen in consultation in 7/15 His Victoza was restarted since 12/30/13 and he has titrated this to 1.8 mg without any side effects Is on the maximum dose now for about a week or so He has better satiety with this His blood sugars are improved significantly although inconsistent He will have sporadic high readings over 180 after meals but he is sometimes checking her sugar about 30 minutes after eating Glucose was higher today right after eating a bagel and some nuts He continues to take Invokana which was started a year ago but is not  benefiting. Also has continued his previous regimen of metformin and 15 mg Actos  Oral hypoglycemic drugs the patient is taking are:   Invokana 300 mg, metformin 850mg  3 times a day and Actos     Side effects from medications have been: None INSULIN regimen is described as:   85 Lantus at bedtime Compliance with the medical regimen: Good  Hypoglycemia:   none  Glucose monitoring:  done 3 times a day         Glucometer: One Touch.      Blood Glucose readings by recall: Blood sugars are in the 230-250 range throughout the day without any pattern  PREMEAL Breakfast pre-lunch   1-4 PM   5-10 PM  Overall  Glucose range:  115-155   132-172   108-236   162-234    Mean/median:      186    Glycemic control: A1c was 9.9% on 12/11/13  Urine microalbumin ratio in 3/15 was 339 but has been as high as 15,772   Lab Results  Component Value Date   HGBA1C  Value: 9.6 (NOTE)  According to the ADA Clinical Practice Recommendations for 2011, when HbA1c is used as a screening test:   >=6.5%   Diagnostic of Diabetes Mellitus           (if abnormal result  is confirmed)  5.7-6.4%   Increased risk of developing Diabetes Mellitus  References:Diagnosis and Classification of Diabetes Mellitus,Diabetes EQAS,3419,62(IWLNL 1):S62-S69 and Standards of Medical Care in         Diabetes - 2011,Diabetes GXQJ,1941,74  (Suppl 1):S11-S61.* 05/25/2010   Lab Results  Component Value Date   LDLCALC 45 01/07/2014   CREATININE 1.1 01/13/2014    Retinal exam: Most recent: 8/14, reportedly no neuropathy    Self-care: The diet that the patient has been following is: Usually balanced with proteins at each meal and low fat  Meals: 3 meals per day. Breakfast is usually  boiled  eggs and oatmeal, has grilled chicken salad at lunch and baked chicken or steak at dinner. Snacks: fruit or crackers Exercise: Gym 3-4/7 30 minutes walking and weights also  Dietician visit:  Most recent:? 2006              Weight history:   Wt Readings from Last 3 Encounters:  01/18/14 232 lb 6.4 oz (105.416 kg)  01/06/14 231 lb 12.8 oz (105.144 kg)  12/29/13 231 lb 6.4 oz (104.962 kg)       Medication List       This list is accurate as of: 01/18/14  2:57 PM.  Always use your most recent med list.               carvedilol 12.5 MG tablet  Commonly known as:  COREG  Take 12.5 mg by mouth daily.     colesevelam 625 MG tablet  Commonly known as:  WELCHOL  Take 1,875 mg by mouth 2 (two) times daily with a meal.     gabapentin 300 MG capsule  Commonly known as:  NEURONTIN  Take 900 mg by mouth 2 (two) times daily.     insulin glargine 100 UNIT/ML injection  Commonly known as:  LANTUS  Inject 0.8 mLs (80 Units total) into the skin at bedtime.     INVOKANA 300 MG Tabs  Generic drug:  Canagliflozin  Take 300 mg by mouth daily.     ketoconazole 2 % shampoo  Commonly known as:  NIZORAL  Apply 1 application topically every other day.     Liraglutide 18 MG/3ML Sopn  Commonly known as:  VICTOZA  Inject 1.8 mg into the skin daily.     losartan 100 MG tablet  Commonly known as:  COZAAR  Take 100 mg by mouth daily.     metFORMIN 850 MG tablet  Commonly known as:  GLUCOPHAGE  Take 850 mg by mouth 3 (three) times daily with meals.     NEXIUM 40 MG capsule  Generic drug:  esomeprazole  Take 1 capsule by mouth daily.     PATADAY 0.2 % Soln  Generic drug:  Olopatadine HCl  Place 1 drop into both eyes daily.     pioglitazone 15 MG tablet  Commonly known as:  ACTOS  Take 15 mg by mouth daily.     simvastatin 10 MG tablet  Commonly known as:  ZOCOR  Take 10 mg by mouth daily.     triamterene-hydrochlorothiazide 37.5-25 MG per tablet  Commonly known as:  MAXZIDE-25  Take 1 tablet by mouth daily.        Allergies:  Allergies  Allergen  Reactions  . Penicillin G Swelling    Eyes swollen    Past Medical History  Diagnosis Date  . Hypertension    . Diabetes mellitus   . GERD (gastroesophageal reflux disease)   . Hepatitis C   . Edema   . Hypercholesteremia   . Vitamin B12 deficiency   . Dyslipidemia   . Proteinuria   . Mixed hyperlipidemia   . Obesity   . Erectile dysfunction   . Anemia     Past Surgical History  Procedure Laterality Date  . Cervical disc surgery    . Penile prosthesis implant    . Mandible fracture surgery      Family History  Problem Relation Age of Onset  . Diabetes Mother   . Heart disease Mother   . Heart disease Sister   . Diabetes Brother   . Heart disease Brother     Social History:  reports that he has quit smoking. He does not have any smokeless tobacco history on file. He reports that he does not drink alcohol or use illicit drugs.    Review of Systems      Lipids: He has been only on low dose simvastatin along the WelChol       Lab Results  Component Value Date   CHOL 114 01/07/2014   HDL 36.20* 01/07/2014   LDLCALC 45 01/07/2014   TRIG 164.0* 01/07/2014   CHOLHDL 3 01/07/2014                  He has a long history of  Numbness, sharp pains and burning in feet treated with Neurontin   Asking about muscle cramps at night   LABS:  Most recent creatinine was 0.95 and 7/50 and ALT/AST 44, 38 in 08/2013, previously as high as 87/85  Physical Examination:  BP 108/68  Pulse 87  Temp(Src) 97.8 F (36.6 C)  Resp 16  Ht 5\' 11"  (1.803 m)  Wt 232 lb 6.4 oz (105.416 kg)  BMI 32.43 kg/m2  SpO2 97%       ASSESSMENT:  Diabetes type 2, uncontrolled  His blood sugars appear to be improving with adding Victoza and titrating this to 1.8 mg He also is on multiple oral agents and Lantus insulin Not clear if he will need mealtime coverage also as he has relatively higher readings after meals periodically; has not checked blood sugars consistently after meals and also doing these about 30 minutes after eating Overall has been compliant with exercise and weight is stable  Muscle cramps:  Likely idiopathic  PLAN:   He will monitor blood sugars as discussed  Discussed that if he has consistent readings over 180 PC he will start NovoLog 8-10 units with that particular meal. He'll call for a prescription if needed  No change in Lantus as yet  Continue current regimen unchanged  He was empirically asked to  try magnesium for muscle cramps   Isack Lavalley 01/18/2014, 2:57 PM   Note: This office note was prepared with Dragon voice recognition system technology. Any transcriptional errors that result from this process are unintentional.

## 2014-01-18 NOTE — Patient Instructions (Signed)
Magnesium 250mg  1-2 pills at bedtime   Please check blood sugars at least half the time about 2 hours after any meal and 3 times per week on waking up. Please bring blood sugar monitor to each visit  Novolog 8-10 at dinner if glucose 2 hrs later stays over 180

## 2014-01-25 ENCOUNTER — Encounter (HOSPITAL_COMMUNITY): Payer: Self-pay | Admitting: *Deleted

## 2014-01-25 NOTE — Progress Notes (Signed)
Patient reports that fasting CBGs  125- 180.  Patient denies chest pains since he was seen by Dr 01/06/14.  "I think that had more to do with my gallbladder."  Patient denies chest pain or shortness.  I spoke with Dr Ermalene Postin about the abnormal stress test and chest pain on 01/06/14, no new orders.

## 2014-01-26 ENCOUNTER — Telehealth: Payer: Self-pay | Admitting: Interventional Cardiology

## 2014-01-26 ENCOUNTER — Ambulatory Visit (HOSPITAL_COMMUNITY): Payer: Medicare HMO

## 2014-01-26 ENCOUNTER — Encounter (HOSPITAL_COMMUNITY)
Admission: RE | Disposition: A | Discharge status: Home / Self Care | Payer: Self-pay | Source: Ambulatory Visit | Attending: Oral Surgery

## 2014-01-26 ENCOUNTER — Ambulatory Visit (HOSPITAL_COMMUNITY)
Admission: RE | Admit: 2014-01-26 | Discharge: 2014-01-26 | Disposition: A | Discharge status: Home / Self Care | Payer: Medicare HMO | Source: Ambulatory Visit | Attending: Oral Surgery | Admitting: Oral Surgery

## 2014-01-26 ENCOUNTER — Encounter (HOSPITAL_COMMUNITY): Payer: Self-pay | Admitting: *Deleted

## 2014-01-26 ENCOUNTER — Ambulatory Visit (HOSPITAL_COMMUNITY): Payer: Medicare HMO | Admitting: Anesthesiology

## 2014-01-26 ENCOUNTER — Encounter (HOSPITAL_COMMUNITY): Payer: Medicare HMO | Admitting: Anesthesiology

## 2014-01-26 DIAGNOSIS — B1920 Unspecified viral hepatitis C without hepatic coma: Secondary | ICD-10-CM | POA: Insufficient documentation

## 2014-01-26 DIAGNOSIS — K0530 Chronic periodontitis, unspecified: Secondary | ICD-10-CM | POA: Diagnosis not present

## 2014-01-26 DIAGNOSIS — Z880 Allergy status to penicillin: Secondary | ICD-10-CM | POA: Diagnosis not present

## 2014-01-26 DIAGNOSIS — K219 Gastro-esophageal reflux disease without esophagitis: Secondary | ICD-10-CM | POA: Insufficient documentation

## 2014-01-26 DIAGNOSIS — I10 Essential (primary) hypertension: Secondary | ICD-10-CM

## 2014-01-26 DIAGNOSIS — E669 Obesity, unspecified: Secondary | ICD-10-CM | POA: Insufficient documentation

## 2014-01-26 DIAGNOSIS — R809 Proteinuria, unspecified: Secondary | ICD-10-CM

## 2014-01-26 DIAGNOSIS — D649 Anemia, unspecified: Secondary | ICD-10-CM | POA: Insufficient documentation

## 2014-01-26 DIAGNOSIS — N529 Male erectile dysfunction, unspecified: Secondary | ICD-10-CM | POA: Diagnosis not present

## 2014-01-26 DIAGNOSIS — E785 Hyperlipidemia, unspecified: Secondary | ICD-10-CM | POA: Insufficient documentation

## 2014-01-26 DIAGNOSIS — E119 Type 2 diabetes mellitus without complications: Secondary | ICD-10-CM | POA: Insufficient documentation

## 2014-01-26 DIAGNOSIS — R079 Chest pain, unspecified: Secondary | ICD-10-CM

## 2014-01-26 DIAGNOSIS — E782 Mixed hyperlipidemia: Secondary | ICD-10-CM

## 2014-01-26 DIAGNOSIS — K029 Dental caries, unspecified: Secondary | ICD-10-CM | POA: Diagnosis not present

## 2014-01-26 DIAGNOSIS — E538 Deficiency of other specified B group vitamins: Secondary | ICD-10-CM | POA: Insufficient documentation

## 2014-01-26 DIAGNOSIS — E1142 Type 2 diabetes mellitus with diabetic polyneuropathy: Secondary | ICD-10-CM

## 2014-01-26 DIAGNOSIS — K089 Disorder of teeth and supporting structures, unspecified: Secondary | ICD-10-CM | POA: Diagnosis present

## 2014-01-26 DIAGNOSIS — E1165 Type 2 diabetes mellitus with hyperglycemia: Secondary | ICD-10-CM

## 2014-01-26 DIAGNOSIS — IMO0001 Reserved for inherently not codable concepts without codable children: Secondary | ICD-10-CM

## 2014-01-26 DIAGNOSIS — R609 Edema, unspecified: Secondary | ICD-10-CM

## 2014-01-26 DIAGNOSIS — E7800 Pure hypercholesterolemia, unspecified: Secondary | ICD-10-CM

## 2014-01-26 HISTORY — PX: MULTIPLE EXTRACTIONS WITH ALVEOLOPLASTY: SHX5342

## 2014-01-26 HISTORY — DX: Shortness of breath: R06.02

## 2014-01-26 LAB — GLUCOSE, CAPILLARY
GLUCOSE-CAPILLARY: 135 mg/dL — AB (ref 70–99)
Glucose-Capillary: 132 mg/dL — ABNORMAL HIGH (ref 70–99)

## 2014-01-26 LAB — COMPREHENSIVE METABOLIC PANEL
ALT: 86 U/L — ABNORMAL HIGH (ref 0–53)
AST: 91 U/L — ABNORMAL HIGH (ref 0–37)
Albumin: 3.6 g/dL (ref 3.5–5.2)
Alkaline Phosphatase: 35 U/L — ABNORMAL LOW (ref 39–117)
Anion gap: 13 (ref 5–15)
BUN: 22 mg/dL (ref 6–23)
CALCIUM: 9.4 mg/dL (ref 8.4–10.5)
CO2: 21 meq/L (ref 19–32)
Chloride: 105 mEq/L (ref 96–112)
Creatinine, Ser: 0.87 mg/dL (ref 0.50–1.35)
GFR calc Af Amer: >90 mL/min (ref >90)
Glucose, Bld: 141 mg/dL — ABNORMAL HIGH (ref 70–99)
Potassium: 5.7 mEq/L — ABNORMAL HIGH (ref 3.7–5.3)
Sodium: 139 mEq/L (ref 137–147)
Total Bilirubin: 0.7 mg/dL (ref 0.3–1.2)
Total Protein: 8 g/dL (ref 6.0–8.3)

## 2014-01-26 LAB — CBC
HCT: 36 % — ABNORMAL LOW (ref 39.0–52.0)
Hemoglobin: 12.3 g/dL — ABNORMAL LOW (ref 13.0–17.0)
MCH: 29.3 pg (ref 26.0–34.0)
MCHC: 34.2 g/dL (ref 30.0–36.0)
MCV: 85.7 fL (ref 78.0–100.0)
PLATELETS: 76 10*3/uL — AB (ref 150–400)
RBC: 4.2 MIL/uL — ABNORMAL LOW (ref 4.22–5.81)
RDW: 13.3 % (ref 11.5–15.5)
WBC: 3.2 10*3/uL — ABNORMAL LOW (ref 4.0–10.5)

## 2014-01-26 SURGERY — MULTIPLE EXTRACTION WITH ALVEOLOPLASTY
Anesthesia: General | Site: Mouth

## 2014-01-26 MED ORDER — CLINDAMYCIN PHOSPHATE 600 MG/50ML IV SOLN
600.0000 mg | INTRAVENOUS | Status: AC
  Administered 2014-01-26: 600 mg via INTRAVENOUS
  Filled 2014-01-26: qty 50

## 2014-01-26 MED ORDER — MIDAZOLAM HCL 2 MG/2ML IJ SOLN
INTRAMUSCULAR | Status: AC
  Filled 2014-01-26: qty 2

## 2014-01-26 MED ORDER — ONDANSETRON HCL 4 MG/2ML IJ SOLN
INTRAMUSCULAR | Status: DC | PRN
  Administered 2014-01-26: 4 mg via INTRAVENOUS

## 2014-01-26 MED ORDER — DEXAMETHASONE SODIUM PHOSPHATE 4 MG/ML IJ SOLN
INTRAMUSCULAR | Status: AC
  Filled 2014-01-26: qty 2

## 2014-01-26 MED ORDER — PROPOFOL 10 MG/ML IV BOLUS
INTRAVENOUS | Status: AC
  Filled 2014-01-26: qty 20

## 2014-01-26 MED ORDER — LIDOCAINE-EPINEPHRINE 2 %-1:100000 IJ SOLN
INTRAMUSCULAR | Status: AC
  Filled 2014-01-26: qty 1

## 2014-01-26 MED ORDER — SUCCINYLCHOLINE CHLORIDE 20 MG/ML IJ SOLN
INTRAMUSCULAR | Status: AC
  Filled 2014-01-26: qty 1

## 2014-01-26 MED ORDER — DEXAMETHASONE SODIUM PHOSPHATE 4 MG/ML IJ SOLN
INTRAMUSCULAR | Status: DC | PRN
  Administered 2014-01-26: 8 mg via INTRAVENOUS

## 2014-01-26 MED ORDER — CLINDAMYCIN HCL 300 MG PO CAPS
300.0000 mg | ORAL_CAPSULE | Freq: Three times a day (TID) | ORAL | Status: DC
Start: 2014-01-26 — End: 2014-04-20

## 2014-01-26 MED ORDER — ROCURONIUM BROMIDE 100 MG/10ML IV SOLN
INTRAVENOUS | Status: DC | PRN
  Administered 2014-01-26: 30 mg via INTRAVENOUS

## 2014-01-26 MED ORDER — LIDOCAINE HCL (CARDIAC) 20 MG/ML IV SOLN
INTRAVENOUS | Status: AC
  Filled 2014-01-26: qty 5

## 2014-01-26 MED ORDER — LIDOCAINE-EPINEPHRINE 2 %-1:100000 IJ SOLN
INTRAMUSCULAR | Status: DC | PRN
  Administered 2014-01-26: 13 mL via INTRADERMAL

## 2014-01-26 MED ORDER — NEOSTIGMINE METHYLSULFATE 10 MG/10ML IV SOLN
INTRAVENOUS | Status: DC | PRN
  Administered 2014-01-26: 3 mg via INTRAVENOUS

## 2014-01-26 MED ORDER — PROPOFOL 10 MG/ML IV BOLUS
INTRAVENOUS | Status: DC | PRN
  Administered 2014-01-26: 180 mg via INTRAVENOUS

## 2014-01-26 MED ORDER — GLYCOPYRROLATE 0.2 MG/ML IJ SOLN
INTRAMUSCULAR | Status: DC | PRN
  Administered 2014-01-26: 0.4 mg via INTRAVENOUS

## 2014-01-26 MED ORDER — ONDANSETRON HCL 4 MG/2ML IJ SOLN
INTRAMUSCULAR | Status: AC
  Filled 2014-01-26: qty 2

## 2014-01-26 MED ORDER — LIDOCAINE HCL (CARDIAC) 20 MG/ML IV SOLN
INTRAVENOUS | Status: DC | PRN
  Administered 2014-01-26: 60 mg via INTRAVENOUS

## 2014-01-26 MED ORDER — HYDROMORPHONE HCL PF 1 MG/ML IJ SOLN: 0.2500 mg | INTRAMUSCULAR | Status: DC | PRN

## 2014-01-26 MED ORDER — OXYCODONE-ACETAMINOPHEN 5-325 MG PO TABS: 1.0000 | ORAL_TABLET | ORAL | Status: DC | PRN

## 2014-01-26 MED ORDER — FENTANYL CITRATE 0.05 MG/ML IJ SOLN
INTRAMUSCULAR | Status: DC | PRN
  Administered 2014-01-26: 150 ug via INTRAVENOUS

## 2014-01-26 MED ORDER — GLYCOPYRROLATE 0.2 MG/ML IJ SOLN
INTRAMUSCULAR | Status: AC
  Filled 2014-01-26: qty 2

## 2014-01-26 MED ORDER — MIDAZOLAM HCL 5 MG/5ML IJ SOLN
INTRAMUSCULAR | Status: DC | PRN
  Administered 2014-01-26: 2 mg via INTRAVENOUS

## 2014-01-26 MED ORDER — OXYMETAZOLINE HCL 0.05 % NA SOLN
NASAL | Status: AC
  Filled 2014-01-26: qty 15

## 2014-01-26 MED ORDER — NEOSTIGMINE METHYLSULFATE 10 MG/10ML IV SOLN
INTRAVENOUS | Status: AC
Start: 2014-01-26 — End: 2014-01-26
  Filled 2014-01-26: qty 1

## 2014-01-26 MED ORDER — 0.9 % SODIUM CHLORIDE (POUR BTL) OPTIME
TOPICAL | Status: DC | PRN
  Administered 2014-01-26: 1000 mL

## 2014-01-26 MED ORDER — FENTANYL CITRATE 0.05 MG/ML IJ SOLN
INTRAMUSCULAR | Status: AC
  Filled 2014-01-26: qty 5

## 2014-01-26 MED ORDER — LACTATED RINGERS IV SOLN
INTRAVENOUS | Status: DC
  Administered 2014-01-26: 08:00:00 via INTRAVENOUS

## 2014-01-26 MED ORDER — SODIUM CHLORIDE 0.9 % IR SOLN
Status: DC | PRN
  Administered 2014-01-26: 1000 mL

## 2014-01-26 SURGICAL SUPPLY — 31 items
BLADE 10 SAFETY STRL DISP (BLADE) ×1 IMPLANT
BUR CROSS CUT FISSURE 1.6 (BURR) ×2 IMPLANT
BUR CROSS CUT FISSURE 1.6MM (BURR) ×1
BUR EGG ELITE 4.0 (BURR) ×2 IMPLANT
BUR EGG ELITE 4.0MM (BURR) ×1
CANISTER SUCTION 2500CC (MISCELLANEOUS) ×3 IMPLANT
COVER SURGICAL LIGHT HANDLE (MISCELLANEOUS) ×3 IMPLANT
CRADLE DONUT ADULT HEAD (MISCELLANEOUS) ×3 IMPLANT
DECANTER SPIKE VIAL GLASS SM (MISCELLANEOUS) ×2 IMPLANT
GAUZE PACKING FOLDED 2  STR (GAUZE/BANDAGES/DRESSINGS) ×2
GAUZE PACKING FOLDED 2 STR (GAUZE/BANDAGES/DRESSINGS) ×1 IMPLANT
GLOVE BIO SURGEON STRL SZ 6.5 (GLOVE) ×4 IMPLANT
GLOVE BIO SURGEON STRL SZ7.5 (GLOVE) ×3 IMPLANT
GLOVE BIO SURGEONS STRL SZ 6.5 (GLOVE) ×2
GLOVE BIOGEL PI IND STRL 7.0 (GLOVE) ×2 IMPLANT
GLOVE BIOGEL PI INDICATOR 7.0 (GLOVE) ×2
GOWN STRL REUS W/ TWL LRG LVL3 (GOWN DISPOSABLE) ×2 IMPLANT
GOWN STRL REUS W/ TWL XL LVL3 (GOWN DISPOSABLE) ×1 IMPLANT
GOWN STRL REUS W/TWL LRG LVL3 (GOWN DISPOSABLE) ×3
GOWN STRL REUS W/TWL XL LVL3 (GOWN DISPOSABLE) ×3
KIT BASIN OR (CUSTOM PROCEDURE TRAY) ×3 IMPLANT
KIT ROOM TURNOVER OR (KITS) ×3 IMPLANT
NEEDLE 22X1 1/2 (OR ONLY) (NEEDLE) ×3 IMPLANT
NS IRRIG 1000ML POUR BTL (IV SOLUTION) ×3 IMPLANT
PAD ARMBOARD 7.5X6 YLW CONV (MISCELLANEOUS) ×6 IMPLANT
SUT CHROMIC 3 0 PS 2 (SUTURE) ×6 IMPLANT
SYR CONTROL 10ML LL (SYRINGE) ×3 IMPLANT
TOWEL OR 17X26 10 PK STRL BLUE (TOWEL DISPOSABLE) ×3 IMPLANT
TRAY ENT MC OR (CUSTOM PROCEDURE TRAY) ×3 IMPLANT
TUBING IRRIGATION (MISCELLANEOUS) ×3 IMPLANT
YANKAUER SUCT BULB TIP NO VENT (SUCTIONS) ×3 IMPLANT

## 2014-01-26 NOTE — Transfer of Care (Signed)
Immediate Anesthesia Transfer of Care Note  Patient: Fernando Peterson  Procedure(s) Performed: Procedure(s): MULTIPLE EXTRACTION WITH ALVEOLOPLASTY (N/A)  Patient Location: PACU  Anesthesia Type:General  Level of Consciousness: sedated  Airway & Oxygen Therapy: Patient Spontanous Breathing and Patient connected to nasal cannula oxygen  Post-op Assessment: Report given to PACU RN, Post -op Vital signs reviewed and stable and Patient moving all extremities  Post vital signs: Reviewed and stable  Complications: No apparent anesthesia complications

## 2014-01-26 NOTE — Op Note (Signed)
01/26/2014  11:09 AM  PATIENT:  Nat Math  58 y.o. male  PRE-OPERATIVE DIAGNOSIS:  non restorable teeth #'s 3, 5, 6, 7, 8, 9, 10, 11, 12, 15, 20, 21  POST-OPERATIVE DIAGNOSIS:  SAME  PROCEDURE:  Procedure(s): MULTIPLE EXTRACTION teeth #'s 3, 5, 6, 7, 8, 9, 10, 11, 12, 15, 20, 21 WITH ALVEOLOPLASTY  SURGEON:  Surgeon(s): Gae Bon, DDS  ANESTHESIA:   local and general  EBL:  minimal  DRAINS: none   SPECIMEN:  No Specimen  COUNTS:  YES  PLAN OF CARE: Discharge to home after PACU  PATIENT DISPOSITION:  PACU - hemodynamically stable.   PROCEDURE DETAILS: Dictation # [id number]  Gae Bon, DMD 01/26/2014 11:09 AM

## 2014-01-26 NOTE — Anesthesia Preprocedure Evaluation (Signed)
Anesthesia Evaluation  Patient identified by MRN, date of birth, ID band Patient awake    Reviewed: Allergy & Precautions, H&P , NPO status , Patient's Chart, lab work & pertinent test results  Airway Mallampati: II      Dental   Pulmonary shortness of breath, former smoker,  breath sounds clear to auscultation        Cardiovascular hypertension, Rhythm:Regular Rate:Normal     Neuro/Psych    GI/Hepatic GERD-  ,(+) Hepatitis -  Endo/Other  diabetes  Renal/GU      Musculoskeletal   Abdominal   Peds  Hematology   Anesthesia Other Findings   Reproductive/Obstetrics                           Anesthesia Physical Anesthesia Plan  ASA: III  Anesthesia Plan: General   Post-op Pain Management:    Induction: Intravenous  Airway Management Planned: Oral ETT  Additional Equipment:   Intra-op Plan:   Post-operative Plan: Possible Post-op intubation/ventilation  Informed Consent: I have reviewed the patients History and Physical, chart, labs and discussed the procedure including the risks, benefits and alternatives for the proposed anesthesia with the patient or authorized representative who has indicated his/her understanding and acceptance.   Dental advisory given  Plan Discussed with: Anesthesiologist and CRNA  Anesthesia Plan Comments:         Anesthesia Quick Evaluation

## 2014-01-26 NOTE — Anesthesia Procedure Notes (Signed)
Procedure Name: Intubation Date/Time: 01/26/2014 10:33 AM Performed by: Melina Copa, Amyia Lodwick R Pre-anesthesia Checklist: Patient identified, Emergency Drugs available, Suction available, Patient being monitored and Timeout performed Patient Re-evaluated:Patient Re-evaluated prior to inductionOxygen Delivery Method: Circle system utilized Preoxygenation: Pre-oxygenation with 100% oxygen Intubation Type: IV induction Ventilation: Mask ventilation with difficulty and Oral airway inserted - appropriate to patient size Laryngoscope Size: Mac and 3 Grade View: Grade I Tube type: Oral Tube size: 8.0 mm Number of attempts: 1 Airway Equipment and Method: Stylet Placement Confirmation: ETT inserted through vocal cords under direct vision,  positive ETCO2 and breath sounds checked- equal and bilateral Secured at: 23 cm Tube secured with: Tape Dental Injury: Teeth and Oropharynx as per pre-operative assessment

## 2014-01-26 NOTE — Discharge Instructions (Signed)
What to Eat after Tooth extraction:     For your first meals, you should eat lightly; only small meals at first.   Avoid Sharp, Crunchy, and Hot foods.   If you do not have nausea, you may eat larger meals.  Avoid spicy, greasy and heavy food, as these may make you sick after the anesthesia.    General Anesthesia, Adult, Care After  Refer to this sheet in the next few weeks. These instructions provide you with information on caring for yourself after your procedure. Your health care provider may also give you more specific instructions. Your treatment has been planned according to current medical practices, but problems sometimes occur. Call your health care provider if you have any problems or questions after your procedure.  WHAT TO EXPECT AFTER THE PROCEDURE  After the procedure, it is typical to experience:  Sleepiness.  Nausea and vomiting. HOME CARE INSTRUCTIONS  For the first 24 hours after general anesthesia:  Have a responsible person with you.  Do not drive a car. If you are alone, do not take public transportation.  Do not drink alcohol.  Do not take medicine that has not been prescribed by your health care provider.  Do not sign important papers or make important decisions.  You may resume a normal diet and activities as directed by your health care provider.  Change bandages (dressings) as directed.  If you have questions or problems that seem related to general anesthesia, call the hospital and ask for the anesthetist or anesthesiologist on call. SEEK MEDICAL CARE IF:  You have nausea and vomiting that continue the day after anesthesia.  You develop a rash. SEEK IMMEDIATE MEDICAL CARE IF:  You have difficulty breathing.  You have chest pain.  You have any allergic problems. Document Released: 08/27/2000 Document Revised: 01/21/2013 Document Reviewed: 12/04/2012  [facility] Patient Information 2014 [place], [place].

## 2014-01-26 NOTE — Op Note (Signed)
NAME:  Fernando Peterson, Fernando Peterson NO.:  [id number]  MEDICAL RECORD NO.:  [id number]  LOCATION:  [facility]                         FACILITY:  [facility]  PHYSICIAN:  Gae Bon, M.D.  DATE OF BIRTH:  1955/06/20  DATE OF PROCEDURE: DATE OF DISCHARGE:                              OPERATIVE REPORT   PREOPERATIVE DIAGNOSIS:  Nonrestorable teeth secondary to dental caries #3, 5, 6, 7, 8, 9, 10, 11, 12, 15, 20, 21.  POSTOPERATIVE DIAGNOSIS:  Nonrestorable teeth secondary to dental caries #3, 5, 6, 7, 8, 9, 10, 11, 12, 15, 20, 21.  PROCEDURE:  Extraction of teeth #3, 5, 6, 7, 8, 9, 10, 11, 12, 15, 20, 21, alveoplasty right and left maxilla and left mandible.  SURGEON:  Gae Bon, MD  ANESTHESIA:  General nasal intubation, Dr. Oletta Lamas attending.  INDICATIONS FOR PROCEDURE:  Fernando Peterson was referred to me by his general dentist for multiple extractions including removal of all upper teeth so that the upper denture could be fabricated.  His past medical history is complicated with hypertension, diabetes, GERD, hep C, obesity.  Because of his multiple medical conditions, it was recommended that anesthesia be performed at the hospital for the patient's safety.  DESCRIPTION OF PROCEDURE:  The patient was taken to the operating room, placed on the table in supine position.  General anesthesia was administered intravenously.  Nasal and oral endotracheal tube was placed and marked.  The patient was prepped and draped for the procedure.  Time- out was performed.  The posterior pharynx was suctioned.  A throat pack was placed.  A 2% lidocaine with 1:100,000 epinephrine was infiltrated in an inferior alveolar block on the left side and a buccal and palatal infiltration in the maxilla, total of 13 mL was utilized.  A bite block was placed in the right side of the mouth, and a sweetheart retractor was used to retract the tongue.  A #15 blade was used to make an incision around teeth #20  and 21.  The 301 elevator was used to elevate these teeth.  The teeth were removed with dental forceps.  However, tooth #21 fractured upon attempted removal, and bone was removed with a Stryker handpiece under irrigation.  Then, the tooth was removed with a 301 elevator.  The bony contour was irregular, and alveoplasty was performed in this area using a rongeur and bone file.  Then, the area was irrigated and closed with 3-0 chromic.  The 15 blade was used to make an incision beginning in the left maxilla tooth #15 carrying forward to tooth #7 both buccally and palatally in the gingival sulcus. The periosteum was reflected with a periosteal elevator.  The teeth were elevated with 301 elevator and the upper #150 forceps was used to remove these teeth.  The sockets were then curetted.  The periosteum was reflected to expose the alveolar crest which was irregular in contour, and then alveoplasty was performed using the egg-shaped bur and bone file.  Then, the area was irrigated and closed with 3-0 chromic.  The endotracheal tube was repositioned to the other side of the mouth as was the sweetheart retractor and bite block.  Then,  attention was turned to the left maxilla.  A 15 blade was used to make an incision around teeth #3, 5 and 6.  The periosteum was reflected, and the teeth were elevated with a dental elevator and removed with the universal forceps.  Teeth #3, 5 and 6 all fractured upon removal requiring additional bone removal with the Stryker handpiece and the use of the root tip pick to extract remaining roots.  Then, the periosteum was reflected and the alveolar crest was irregular and thus alveoplasty was performed using the egg- shaped bur and bone file.  Then, the area was irrigated and closed with 3-0 chromic.  Then, the oral cavity was inspected and found to have good contour, hemostasis, and closure.  The oral cavity was irrigated, suctioned, and throat pack was removed.  The  patient awakened, taken to the recovery room, breathing spontaneously in good condition.  ESTIMATED BLOOD LOSS:  Minimal.  COMPLICATIONS:  None.  SPECIMENS:  None.     Gae Bon, M.D.     SMJ/MEDQ  D:  01/26/2014  T:  01/26/2014  Job:  [id number]

## 2014-01-26 NOTE — H&P (Signed)
HISTORY AND PHYSICAL  Fernando Peterson is a 58 y.o. male patient with CC: painful teeth  No diagnosis found.  Past Medical History  Diagnosis Date  . Hypertension   . Diabetes mellitus   . GERD (gastroesophageal reflux disease)   . Hepatitis C   . Edema   . Hypercholesteremia   . Vitamin B12 deficiency   . Dyslipidemia   . Proteinuria   . Mixed hyperlipidemia   . Obesity   . Erectile dysfunction   . Anemia   . Shortness of breath     with exertion    Current Facility-Administered Medications  Medication Dose Route Frequency Provider Last Rate Last Dose  . clindamycin (CLEOCIN) IVPB 600 mg  600 mg Intravenous On Call to [place], DDS      . lactated ringers infusion   Intravenous Continuous Gae Bon, DDS 50 mL/hr at 01/26/14 0805     Allergies  Allergen Reactions  . Penicillin G Swelling    Eyes swollen   Active Problems:   * No active hospital problems. *  Vitals: Blood pressure 163/99, pulse 83, temperature 97.3 F (36.3 C), temperature source Oral, resp. rate 16, height 5\' 11"  (1.803 m), weight 105.235 kg (232 lb), SpO2 99.00%. Lab results: Results for orders placed during the hospital encounter of 01/26/14 (from the past 24 hour(s))  GLUCOSE, CAPILLARY     Status: Abnormal   Collection Time    01/26/14  7:59 AM      Result Value Ref Range   Glucose-Capillary 135 (*) 70 - 99 mg/dL   Radiology Results: Dg Chest 2 View  01/26/2014   CLINICAL DATA:  Preoperative chest x-ray  EXAM: CHEST  2 VIEW  COMPARISON:  Prior chest x-ray 12/23/2010  FINDINGS: The lungs are clear and negative for focal airspace consolidation, pulmonary edema or suspicious pulmonary nodule. Mild interstitial prominence in the bases is similar compared to prior. No pleural effusion or pneumothorax. Cardiac and mediastinal contours are within normal limits. No acute fracture or lytic or blastic osseous lesions. The visualized upper abdominal bowel gas pattern is unremarkable.  IMPRESSION:  No active cardiopulmonary disease.   Electronically Signed   By: Jacqulynn Cadet M.D.   On: 01/26/2014 08:26   General appearance: alert, cooperative and no distress Head: Normocephalic, without obvious abnormality, atraumatic Eyes: negative Nose: Nares normal. Septum midline. Mucosa normal. No drainage or sinus tenderness. Throat: multiple dental caries maxillary teeth and left mandibular premolars; pharynx  clear; no oral lesions Neck: no adenopathy, supple, symmetrical, trachea midline and thyroid not enlarged, symmetric, no tenderness/mass/nodules Resp: clear to auscultation bilaterally Cardio: regular rate and rhythm, S1, S2 normal, no murmur, click, rub or gallop  Assessment: Chronic severe dental caries, periodontitis. nonrestorable teeth.   Plan: Multiple dental extractions, alveoloplasty. General anesthesia. Day surgery.   Gae Bon 01/26/2014

## 2014-01-26 NOTE — Progress Notes (Signed)
Mr. Putt arrived to pacu and attached to monitors/oxygen.  Mr. Chewning apneic.  Claybon Jabs CRNA at [facility] providing bag mask ventilations until spontaneous respirations returned.  Pt did not lose a pulse but was intermittently hypotensive for only a minute or so.

## 2014-01-26 NOTE — Anesthesia Postprocedure Evaluation (Signed)
  Anesthesia Post-op Note  Patient: Fernando Peterson  Procedure(s) Performed: Procedure(s): MULTIPLE EXTRACTION WITH ALVEOLOPLASTY (N/A)  Patient Location: PACU  Anesthesia Type:General  Level of Consciousness: awake  Airway and Oxygen Therapy: Patient Spontanous Breathing  Post-op Pain: mild  Post-op Assessment: Post-op Vital signs reviewed  Post-op Vital Signs: Reviewed  Last Vitals:  Filed Vitals:   01/26/14 1140  BP: 137/81  Pulse: 89  Temp:   Resp: 11    Complications: No apparent anesthesia complications

## 2014-01-26 NOTE — Telephone Encounter (Signed)
Reviewed note from Estella Husk, [place].  Spoke to Dr. Oletta Lamas with anesthesia. The patient has had an abnormal stress test in 2014 that was medically managed. Based on last note, he has been walking on the treadmill without exertional cardiac symptoms. As long as he is not having any symptoms with exercise, no further cardiac testing is needed before dental extraction.

## 2014-01-27 ENCOUNTER — Encounter (HOSPITAL_COMMUNITY): Payer: Self-pay | Admitting: Oral Surgery

## 2014-01-27 ENCOUNTER — Encounter: Payer: Medicare HMO | Admitting: Gastroenterology

## 2014-01-29 NOTE — Telephone Encounter (Signed)
As of yet we still do not have records     This patient has not been scheduled for a colonoscopy yet  Do you want to schedule her without the records Dr Deatra Ina??

## 2014-01-31 NOTE — Telephone Encounter (Signed)
What is the indication for colo?  Is there a h/o polyps?

## 2014-02-01 ENCOUNTER — Other Ambulatory Visit (INDEPENDENT_AMBULATORY_CARE_PROVIDER_SITE_OTHER): Payer: Self-pay

## 2014-02-01 ENCOUNTER — Encounter (INDEPENDENT_AMBULATORY_CARE_PROVIDER_SITE_OTHER): Payer: Self-pay | Admitting: General Surgery

## 2014-02-01 ENCOUNTER — Ambulatory Visit (INDEPENDENT_AMBULATORY_CARE_PROVIDER_SITE_OTHER): Payer: Commercial Managed Care - HMO | Admitting: General Surgery

## 2014-02-01 VITALS — BP 122/82 | HR 79 | Temp 98.8°F | Ht 71.0 in | Wt 231.4 lb

## 2014-02-01 DIAGNOSIS — K8020 Calculus of gallbladder without cholecystitis without obstruction: Secondary | ICD-10-CM

## 2014-02-01 DIAGNOSIS — E1165 Type 2 diabetes mellitus with hyperglycemia: Secondary | ICD-10-CM

## 2014-02-01 DIAGNOSIS — IMO0001 Reserved for inherently not codable concepts without codable children: Secondary | ICD-10-CM

## 2014-02-01 NOTE — Telephone Encounter (Signed)
Robin: pt did not know what results of prior colonoscopy.  He did not know if he had polyps or not

## 2014-02-01 NOTE — Patient Instructions (Signed)
You have gallstones. 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 You have portal hypertension. You have a low platelet count which increases your risk for bleeding.  You attacks of pain may or may not be due to your gallbladder.  Stay away from fatty foods and he a very low-fat diet for now.  You will be sent for blood work and x-rays to see what is going on.  Return to see Dr. Dalbert Batman in a few weeks and we will discuss this further.     Laparoscopic Cholecystectomy Laparoscopic cholecystectomy is surgery to remove the gallbladder. The gallbladder is located in the upper right part of the abdomen, behind the liver. It is a storage sac for bile produced in the liver. Bile aids in the digestion and absorption of fats. Cholecystectomy is often done for inflammation of the gallbladder (cholecystitis). This condition is usually caused by a buildup of gallstones (cholelithiasis) in your gallbladder. Gallstones can block the flow of bile, resulting in inflammation and pain. In severe cases, emergency surgery may be required. When emergency surgery is not required, you will have time to prepare for the procedure. Laparoscopic surgery is an alternative to open surgery. Laparoscopic surgery has a shorter recovery time. Your common bile duct may also need to be examined during the procedure. If stones are found in the common bile duct, they may be removed. LET [facility] CARE PROVIDER KNOW ABOUT:  Any allergies you have.  All medicines you are taking, including vitamins, herbs, eye drops, creams, and over-the-counter medicines.  Previous problems you or members of your family have had with the use of anesthetics.  Any blood disorders you have.  Previous surgeries you have had.  Medical conditions you have. RISKS AND COMPLICATIONS Generally, this is a safe procedure. However, as with any procedure, complications can occur. Possible complications include:  Infection.  Damage to the common bile duct,  nerves, arteries, veins, or other internal organs such as the stomach, liver, or intestines.  Bleeding.  A stone may remain in the common bile duct.  A bile leak from the cyst duct that is clipped when your gallbladder is removed.  The need to convert to open surgery, which requires a larger incision in the abdomen. This may be necessary if your surgeon thinks it is not safe to continue with a laparoscopic procedure. BEFORE THE PROCEDURE  Ask your health care provider about changing or stopping any regular medicines. You will need to stop taking aspirin or blood thinners at least 5 days prior to surgery.  Do not eat or drink anything after midnight the night before surgery.  Let your health care provider know if you develop a cold or other infectious problem before surgery. PROCEDURE   You will be given medicine to make you sleep through the procedure (general anesthetic). A breathing tube will be placed in your mouth.  When you are asleep, your surgeon will make several small cuts (incisions) in your abdomen.  A thin, lighted tube with a tiny camera on the end (laparoscope) is inserted through one of the small incisions. The camera on the laparoscope sends a picture to a TV screen in the operating room. This gives the surgeon a good view inside your abdomen.  A gas will be pumped into your abdomen. This expands your abdomen so that the surgeon has more room to perform the surgery.  Other tools needed for the procedure are inserted through the other incisions. The gallbladder is removed through one of the incisions.  After the removal of your gallbladder, the incisions will be closed with stitches, staples, or skin glue. AFTER THE PROCEDURE  You will be taken to a recovery area where your progress will be checked often.  You may be allowed to go home the same day if your pain is controlled and you can tolerate liquids. Document Released: 05/21/2005 Document Revised: 03/11/2013  Document Reviewed: 12/31/2012 [facility] Patient Information 2015 [place], [place]. This information is not intended to replace advice given to you by your health care provider. Make sure you discuss any questions you have with your health care provider.

## 2014-02-01 NOTE — Progress Notes (Signed)
Patient ID: Fernando Peterson, male   DOB: [birth year], 58 y.o.   MRN: [id number]  Chief Complaint  Patient presents with  . New Evaluation    gallbladder    HPI RAJON BISIG is a 58 y.o. male.  He is referred back to me by Dr. Orland Penman at [place] [facility] [employer] to see if he needs to have his gallbladder removed.  I evaluated this gentleman previously in July of 2012. He was told that he might be having a gallbladder attacks.   He became asymptomatic. We talked about the risk of surgery and the risk of future problems and decided not to do anything.  In the interim he actually has not had any abdominal pain. He has no cirrhosis and portal hypertension from hepatitis C, insulin-dependent diabetes, hypertension, mild obesity, GERD, disability from 2 cervical fusions, recent dental extractions.  He now gives a three-month history of intermittent attacks of sharp sudden epigastric pain radiating to the left upper quadrant. He notices this after fast food and fatty meals and if he doesn't he does that he doesn't have any symptoms. It happens once or twice a week. No change in his bowel habits. No vomiting.  Recent lab work shows thrombus cytopenia with a platelet count of 76,000. Bilirubin normal. Transaminases elevated somewhat.  Recently seen by cardiology, Dr. Irish Lack  He states that that all the tests for cardiac source was negative. He states that [place].  he told his daughter is his gallbladder.He is asymptomatic today. HPI  Past Medical History  Diagnosis Date  . Hypertension   . Diabetes mellitus   . GERD (gastroesophageal reflux disease)   . Hepatitis C   . Edema   . Hypercholesteremia   . Vitamin B12 deficiency   . Dyslipidemia   . Proteinuria   . Mixed hyperlipidemia   . Obesity   . Erectile dysfunction   . Anemia   . Shortness of breath     with exertion    Past Surgical History  Procedure Laterality Date  . Cervical disc surgery    . Penile prosthesis implant    .  Mandible fracture surgery    . Multiple extractions with alveoloplasty N/A 01/26/2014    Procedure: MULTIPLE EXTRACTION WITH ALVEOLOPLASTY;  Surgeon: Gae Bon, DDS;  Location: [facility];  Service: Oral Surgery;  Laterality: N/A;    Family History  Problem Relation Age of Onset  . Diabetes Mother   . Heart disease Mother   . Heart disease Sister   . Diabetes Brother   . Heart disease Brother     Social History History  Substance Use Topics  . Smoking status: Former Smoker -- 1.50 packs/day for 25 years  . Smokeless tobacco: Never Used     Comment: Quit in 1992  . Alcohol Use: No    Allergies  Allergen Reactions  . Penicillin G Swelling    Eyes swollen    Current Outpatient Prescriptions  Medication Sig Dispense Refill  . carvedilol (COREG) 12.5 MG tablet Take 12.5 mg by mouth daily.       . clindamycin (CLEOCIN) 300 MG capsule Take 1 capsule (300 mg total) by mouth 3 (three) times daily.  21 capsule  0  . colesevelam (WELCHOL) 625 MG tablet Take 1,875 mg by mouth 2 (two) times daily with a meal.        . gabapentin (NEURONTIN) 300 MG capsule Take 300 mg by mouth 4 (four) times daily.       Marland Kitchen  insulin glargine (LANTUS) 100 UNIT/ML injection Inject 85 Units into the skin at bedtime.      . INVOKANA 300 MG TABS Take 300 mg by mouth daily.       Marland Kitchen ketoconazole (NIZORAL) 2 % shampoo Apply 1 application topically every other day.      . Liraglutide (VICTOZA) 18 MG/3ML SOPN Inject 1.8 mg into the skin daily.  3 pen  2  . losartan (COZAAR) 100 MG tablet Take 100 mg by mouth daily.       . metFORMIN (GLUCOPHAGE) 850 MG tablet Take 850 mg by mouth 3 (three) times daily with meals.       Marland Kitchen NEXIUM 40 MG capsule Take 1 capsule by mouth daily.      Marland Kitchen PATADAY 0.2 % SOLN Place 1 drop into both eyes daily.       . pioglitazone (ACTOS) 15 MG tablet Take 15 mg by mouth daily.       . simvastatin (ZOCOR) 10 MG tablet Take 10 mg by mouth daily.       Marland Kitchen triamterene-hydrochlorothiazide  (MAXZIDE-25) 37.5-25 MG per tablet Take 1 tablet by mouth daily.        Marland Kitchen oxyCODONE-acetaminophen (PERCOCET) 5-325 MG per tablet Take 1-2 tablets by mouth every 4 (four) hours as needed.  40 tablet  0   No current facility-administered medications for this visit.    Review of Systems Review of Systems  Constitutional: Negative for fever, chills and unexpected weight change.  HENT: Negative for congestion, hearing loss, sore throat, trouble swallowing and voice change.   Eyes: Negative for visual disturbance.  Respiratory: Negative for cough and wheezing.   Cardiovascular: Negative for chest pain, palpitations and leg swelling.  Gastrointestinal: Positive for nausea and abdominal pain. Negative for vomiting, diarrhea, constipation, blood in stool, abdominal distention, anal bleeding and rectal pain.  Genitourinary: Negative for hematuria and difficulty urinating.  Musculoskeletal: Negative for arthralgias.  Skin: Negative for rash and wound.  Neurological: Negative for seizures, syncope, weakness and headaches.  Hematological: Negative for adenopathy. Does not bruise/bleed easily.  Psychiatric/Behavioral: Negative for confusion.    Blood pressure 122/82, pulse 79, temperature 98.8 F (37.1 C), temperature source Oral, height 5\' 11"  (1.803 m), weight 231 lb 6 oz (104.951 kg).  Physical Exam Physical Exam  Constitutional: He is oriented to person, place, and time. He appears well-developed and well-nourished. No distress.  HENT:  Head: Normocephalic.  Nose: Nose normal.  Mouth/Throat: No oropharyngeal exudate.  Eyes: Conjunctivae and EOM are normal. Pupils are equal, round, and reactive to light. Right eye exhibits no discharge. Left eye exhibits no discharge. No scleral icterus.  Neck: Normal range of motion. Neck supple. No JVD present. No tracheal deviation present. No thyromegaly present.  Cardiovascular: Normal rate, regular rhythm, normal heart sounds and intact distal pulses.    No murmur heard. Pulmonary/Chest: Effort normal and breath sounds normal. No stridor. No respiratory distress. He has no wheezes. He has no rales. He exhibits no tenderness.  Abdominal: Soft. Bowel sounds are normal. He exhibits no distension and no mass. There is no tenderness. There is no rebound and no guarding.  No organomegaly. No tenderness. No mass. No fluid wave. No scars. No hernias.  Musculoskeletal: Normal range of motion. He exhibits no edema and no tenderness.  Lymphadenopathy:    He has no cervical adenopathy.  Neurological: He is alert and oriented to person, place, and time. He has normal reflexes. Coordination normal.  Skin: Skin is warm and  dry. No rash noted. He is not diaphoretic. No erythema. No pallor.  Psychiatric: He has a normal mood and affect. His behavior is normal. Judgment and thought content normal.    Data Reviewed Recent lab work. Recent physician office notes. Prior CT scan. Mild meds.  Assessment    Upper abdominal pain, intermittent. The sharp sudden nature of this and his radiation of the left upper quadrant is atypical, however fatty food intolerance and postprandial nature is consistent with biliary tract disease.  Chronic cholecystitis with cholelithiasis. Contracted gallbladder with gallstones on CT scan 2012  Hepatitis C related cirrhosis. Portal hypertension diagnosed by CT but minimal collaterals of in 2011. . 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 Increased risk of bleeding  Insulin-dependent diabetes With peripheral neuropathy  Hypertension  obesity  Hyperlipidemia  Status post multiple cervical fusions  Status post penile implant     Plan    He may or may not benefit from cholecystectomy. Because of his multiple comorbidities we need to be cautious before making a decision. High rish.  Low fat diet advised. This seems to elevate  his symptoms  CT scan abdomen and pelvis to assess degree of portal hypertension and collateralization  Hepatobiliary scan with ejection fraction  Check PT, INR, PTT  Return to see me in one month       Junior Kenedy M 02/01/2014, 9:59 AM

## 2014-02-01 NOTE — Telephone Encounter (Signed)
We do need to get the results of his prior colonoscopy.  Does patient have a family history of colon cancer?  If so thEN he can proceed with colonoscopy.  If not, I would defer colonoscopy until we review the records.  In addition, he has a history of hepatitis C.  Please find out if he has been treated.  He probably should have an office visit.

## 2014-02-01 NOTE — Telephone Encounter (Signed)
Dr Deatra Ina, Patient does not know if he had colon polyps  Should we schedule him for a colonoscopy

## 2014-02-01 NOTE — Telephone Encounter (Signed)
Juliann Pulse, What was his reason for having the colonoscopy

## 2014-02-04 NOTE — Telephone Encounter (Signed)
Called pt and left message for him to return my call

## 2014-02-04 NOTE — Telephone Encounter (Signed)
Called pt and left a message.

## 2014-02-05 ENCOUNTER — Ambulatory Visit (HOSPITAL_COMMUNITY)
Admission: RE | Admit: 2014-02-05 | Discharge: 2014-02-05 | Disposition: A | Discharge status: Home / Self Care | Payer: Medicare HMO | Source: Ambulatory Visit | Attending: General Surgery | Admitting: General Surgery

## 2014-02-05 ENCOUNTER — Encounter (HOSPITAL_COMMUNITY): Payer: Self-pay

## 2014-02-05 DIAGNOSIS — K8020 Calculus of gallbladder without cholecystitis without obstruction: Secondary | ICD-10-CM | POA: Insufficient documentation

## 2014-02-05 DIAGNOSIS — R109 Unspecified abdominal pain: Secondary | ICD-10-CM | POA: Diagnosis present

## 2014-02-05 DIAGNOSIS — N289 Disorder of kidney and ureter, unspecified: Secondary | ICD-10-CM | POA: Diagnosis not present

## 2014-02-05 MED ORDER — IOHEXOL 300 MG/ML  SOLN
100.0000 mL | Freq: Once | INTRAMUSCULAR | Status: AC | PRN
  Administered 2014-02-05: 100 mL via INTRAVENOUS

## 2014-02-09 ENCOUNTER — Ambulatory Visit (HOSPITAL_COMMUNITY)
Admission: RE | Admit: 2014-02-09 | Discharge: 2014-02-09 | Disposition: A | Discharge status: Home / Self Care | Payer: Medicare HMO | Source: Ambulatory Visit | Attending: General Surgery | Admitting: General Surgery

## 2014-02-09 DIAGNOSIS — R110 Nausea: Secondary | ICD-10-CM | POA: Diagnosis not present

## 2014-02-09 DIAGNOSIS — R1011 Right upper quadrant pain: Secondary | ICD-10-CM | POA: Insufficient documentation

## 2014-02-09 DIAGNOSIS — K9089 Other intestinal malabsorption: Secondary | ICD-10-CM | POA: Insufficient documentation

## 2014-02-09 DIAGNOSIS — K8020 Calculus of gallbladder without cholecystitis without obstruction: Secondary | ICD-10-CM | POA: Diagnosis not present

## 2014-02-09 MED ORDER — MORPHINE SULFATE 4 MG/ML IJ SOLN
4.0000 mg | Freq: Once | INTRAMUSCULAR | Status: AC
  Administered 2014-02-09: 4 mg via INTRAVENOUS

## 2014-02-09 MED ORDER — MORPHINE SULFATE 4 MG/ML IJ SOLN
INTRAMUSCULAR | Status: AC
  Filled 2014-02-09: qty 1

## 2014-02-09 MED ORDER — TECHNETIUM TC 99M MEBROFENIN IV KIT
5.0000 | PACK | Freq: Once | INTRAVENOUS | Status: AC | PRN
  Administered 2014-02-09: 5 via INTRAVENOUS

## 2014-02-25 NOTE — Telephone Encounter (Signed)
Left several messages no return call for records

## 2014-03-15 ENCOUNTER — Other Ambulatory Visit: Payer: Commercial Managed Care - HMO

## 2014-03-18 ENCOUNTER — Ambulatory Visit: Payer: Commercial Managed Care - HMO | Admitting: Endocrinology

## 2014-04-01 ENCOUNTER — Other Ambulatory Visit: Payer: Self-pay | Admitting: *Deleted

## 2014-04-01 MED ORDER — LIRAGLUTIDE 18 MG/3ML ~~LOC~~ SOPN: 1.8000 mg | PEN_INJECTOR | Freq: Every day | SUBCUTANEOUS | Status: DC

## 2014-04-06 ENCOUNTER — Other Ambulatory Visit: Payer: Self-pay | Admitting: *Deleted

## 2014-04-06 ENCOUNTER — Encounter: Payer: Commercial Managed Care - HMO | Admitting: Endocrinology

## 2014-04-06 ENCOUNTER — Other Ambulatory Visit: Payer: Self-pay | Admitting: Endocrinology

## 2014-04-06 ENCOUNTER — Encounter: Payer: Self-pay | Admitting: Endocrinology

## 2014-04-06 DIAGNOSIS — IMO0002 Reserved for concepts with insufficient information to code with codable children: Secondary | ICD-10-CM

## 2014-04-06 DIAGNOSIS — E1165 Type 2 diabetes mellitus with hyperglycemia: Secondary | ICD-10-CM

## 2014-04-06 LAB — BASIC METABOLIC PANEL
BUN: 16 mg/dL (ref 6–23)
CO2: 25 mEq/L (ref 19–32)
Calcium: 9.1 mg/dL (ref 8.4–10.5)
Chloride: 104 mEq/L (ref 96–112)
Creat: 0.93 mg/dL (ref 0.50–1.35)
Glucose, Bld: 84 mg/dL (ref 70–99)
Potassium: 4.5 mEq/L (ref 3.5–5.3)
Sodium: 139 mEq/L (ref 135–145)

## 2014-04-06 LAB — HEMOGLOBIN A1C
HEMOGLOBIN A1C: 7.1 % — AB (ref <5.7)
MEAN PLASMA GLUCOSE: 157 mg/dL — AB (ref <117)

## 2014-04-06 NOTE — Progress Notes (Signed)
This encounter was created in error - please disregard.

## 2014-04-06 NOTE — Progress Notes (Deleted)
Patient ID: Fernando Peterson, male   DOB: [birth year], 58 y.o.   MRN: [id number]    Reason for Appointment: Followup for Type 2 Diabetes  Referring physician: NNODI  History of Present Illness:          Diagnosis: Type 2 diabetes mellitus, date of diagnosis: 1992        Past history: He was initially treated with oral agents such as Glucotrol, Glucophage, Glucovance and Actos In 2001 he was put on insulin because of poor control and apparently also had liver enzyme abnormalities Since then he has had difficulty controlling his diabetes except with high doses of insulin and adding agents such as Symlin In 2000 he was taking a total of 225 units of 3 types of insulin including NPH at bedtime because of significantly high fasting readings He has been seen by various physicians in the last couple of years and is using various regimens for his diabetes Review of his A1c records indicate that blood sugars have been mostly poorly controlled with A1c range 7.5-9.9 with only one reading below 8% which was in 08/2013 At that time he had just come off Victoza which he had taken for 6 months at 1.8 mg dose He thinks that with Victoza blood sugars were generally been 100-130 range at various times He was taking this in combination with only Lantus insulin along with Invokana  Recent history:  Because of poor control and glucose readings consistently over 200 he was seen in consultation in 7/15 His Victoza was restarted since 12/30/13 and he has titrated this to 1.8 mg without any side effects Is on the maximum dose now for about a week or so He has better satiety with this His blood sugars are improved significantly although inconsistent He will have sporadic high readings over 180 after meals but he is sometimes checking her sugar about 30 minutes after eating Glucose was higher today right after eating a bagel and some nuts He continues to take Invokana which was started a year ago but is not  benefiting. Also has continued his previous regimen of metformin and 15 mg Actos  Oral hypoglycemic drugs the patient is taking are:   Invokana 300 mg, metformin 850mg  3 times a day and Actos     Side effects from medications have been: None INSULIN regimen is described as:   85 Lantus at bedtime Compliance with the medical regimen: Good  Hypoglycemia:   none  Glucose monitoring:  done 3 times a day         Glucometer: One Touch.      Blood Glucose readings by recall: Blood sugars are in the 230-250 range throughout the day without any pattern  PREMEAL Breakfast pre-lunch   1-4 PM   5-10 PM  Overall  Glucose range:  115-155   132-172   108-236   162-234    Mean/median:      186    Glycemic control: A1c was 9.9% on 12/11/13  Urine microalbumin ratio in 3/15 was 339 but has been as high as 15,772   Lab Results  Component Value Date   HGBA1C * 05/25/2010    9.6 (NOTE)  According to the ADA Clinical Practice Recommendations for 2011, when HbA1c is used as a screening test:   >=6.5%   Diagnostic of Diabetes Mellitus           (if abnormal result  is confirmed)  5.7-6.4%   Increased risk of developing Diabetes Mellitus  References:Diagnosis and Classification of Diabetes Mellitus,Diabetes GEZM,6294,76(LYYTK 1):S62-S69 and Standards of Medical Care in         Diabetes - 2011,Diabetes PTWS,5681,27  (Suppl 1):S11-S61.   Lab Results  Component Value Date   LDLCALC 45 01/07/2014   CREATININE 0.87 01/26/2014    Retinal exam: Most recent: 8/14, reportedly no neuropathy    Self-care: The diet that the patient has been following is: Usually balanced with proteins at each meal and low fat  Meals: 3 meals per day. Breakfast is usually  boiled  eggs and oatmeal, has grilled chicken salad at lunch and baked chicken or steak at dinner. Snacks: fruit or crackers Exercise: Gym 3-4/7 30 minutes walking and weights also  Dietician  visit: Most recent:? 2006              Weight history:   Wt Readings from Last 3 Encounters:  02/01/14 231 lb 6 oz (104.951 kg)  01/26/14 232 lb (105.235 kg)  01/18/14 232 lb 6.4 oz (105.416 kg)       Medication List       This list is accurate as of: 04/06/14  9:45 AM.  Always use your most recent med list.               carvedilol 12.5 MG tablet  Commonly known as:  COREG  Take 12.5 mg by mouth daily.     clindamycin 300 MG capsule  Commonly known as:  CLEOCIN  Take 1 capsule (300 mg total) by mouth 3 (three) times daily.     colesevelam 625 MG tablet  Commonly known as:  WELCHOL  Take 1,875 mg by mouth 2 (two) times daily with a meal.     gabapentin 300 MG capsule  Commonly known as:  NEURONTIN  Take 300 mg by mouth 4 (four) times daily.     insulin glargine 100 UNIT/ML injection  Commonly known as:  LANTUS  Inject 85 Units into the skin at bedtime.     INVOKANA 300 MG Tabs tablet  Generic drug:  canagliflozin  Take 300 mg by mouth daily.     ketoconazole 2 % shampoo  Commonly known as:  NIZORAL  Apply 1 application topically every other day.     Liraglutide 18 MG/3ML Sopn  Commonly known as:  VICTOZA  Inject 1.8 mg into the skin daily.     losartan 100 MG tablet  Commonly known as:  COZAAR  Take 100 mg by mouth daily.     metFORMIN 850 MG tablet  Commonly known as:  GLUCOPHAGE  Take 850 mg by mouth 3 (three) times daily with meals.     NEXIUM 40 MG capsule  Generic drug:  esomeprazole  Take 1 capsule by mouth daily.     oxyCODONE-acetaminophen 5-325 MG per tablet  Commonly known as:  PERCOCET  Take 1-2 tablets by mouth every 4 (four) hours as needed.     PATADAY 0.2 % Soln  Generic drug:  Olopatadine HCl  Place 1 drop into both eyes daily.     pioglitazone 15 MG tablet  Commonly known as:  ACTOS  Take 15 mg by mouth daily.     simvastatin 10  MG tablet  Commonly known as:  ZOCOR  Take 10 mg by mouth daily.      triamterene-hydrochlorothiazide 37.5-25 MG per tablet  Commonly known as:  MAXZIDE-25  Take 1 tablet by mouth daily.        Allergies:  Allergies  Allergen Reactions  . Penicillin G Swelling    Eyes swollen    Past Medical History  Diagnosis Date  . Hypertension   . Diabetes mellitus   . GERD (gastroesophageal reflux disease)   . Hepatitis C   . Edema   . Hypercholesteremia   . Vitamin B12 deficiency   . Dyslipidemia   . Proteinuria   . Mixed hyperlipidemia   . Obesity   . Erectile dysfunction   . Anemia   . Shortness of breath     with exertion    Past Surgical History  Procedure Laterality Date  . Cervical disc surgery    . Penile prosthesis implant    . Mandible fracture surgery    . Multiple extractions with alveoloplasty N/A 01/26/2014    Procedure: MULTIPLE EXTRACTION WITH ALVEOLOPLASTY;  Surgeon: Gae Bon, DDS;  Location: [place];  Service: Oral Surgery;  Laterality: N/A;    Family History  Problem Relation Age of Onset  . Diabetes Mother   . Heart disease Mother   . Heart disease Sister   . Diabetes Brother   . Heart disease Brother     Social History:  reports that he has quit smoking. He has never used smokeless tobacco. He reports that he does not drink alcohol or use illicit drugs.    Review of Systems      Lipids: He has been only on low dose simvastatin along the WelChol       Lab Results  Component Value Date   CHOL 114 01/07/2014   HDL 36.20* 01/07/2014   LDLCALC 45 01/07/2014   TRIG 164.0* 01/07/2014   CHOLHDL 3 01/07/2014                  He has a long history of  Numbness, sharp pains and burning in feet treated with Neurontin   Asking about muscle cramps at night   LABS:  Most recent creatinine was 0.95 and 7/50 and ALT/AST 44, 38 in 08/2013, previously as high as 87/85  Physical Examination:  There were no vitals taken for this visit.       ASSESSMENT:  Diabetes type 2, uncontrolled  His blood sugars appear to  be improving with adding Victoza and titrating this to 1.8 mg He also is on multiple oral agents and Lantus insulin Not clear if he will need mealtime coverage also as he has relatively higher readings after meals periodically; has not checked blood sugars consistently after meals and also doing these about 30 minutes after eating Overall has been compliant with exercise and weight is stable  Muscle cramps: Likely idiopathic  PLAN:   He will monitor blood sugars as discussed  Discussed that if he has consistent readings over 180 PC he will start NovoLog 8-10 units with that particular meal. He'll call for a prescription if needed  No change in Lantus as yet  Continue current regimen unchanged  He was empirically asked to  try magnesium for muscle cramps   Karry Barrilleaux 04/06/2014, 9:45 AM   Note: This office note was prepared with Dragon voice recognition system technology. Any transcriptional errors that result from this process are unintentional.

## 2014-04-20 ENCOUNTER — Ambulatory Visit (INDEPENDENT_AMBULATORY_CARE_PROVIDER_SITE_OTHER): Payer: Medicare HMO | Admitting: Endocrinology

## 2014-04-20 ENCOUNTER — Encounter: Payer: Self-pay | Admitting: Endocrinology

## 2014-04-20 VITALS — BP 129/77 | HR 88 | Temp 98.0°F | Resp 14 | Ht 71.0 in | Wt 230.8 lb

## 2014-04-20 DIAGNOSIS — E1149 Type 2 diabetes mellitus with other diabetic neurological complication: Secondary | ICD-10-CM

## 2014-04-20 DIAGNOSIS — E1165 Type 2 diabetes mellitus with hyperglycemia: Secondary | ICD-10-CM

## 2014-04-20 DIAGNOSIS — I10 Essential (primary) hypertension: Secondary | ICD-10-CM

## 2014-04-20 DIAGNOSIS — IMO0002 Reserved for concepts with insufficient information to code with codable children: Secondary | ICD-10-CM

## 2014-04-20 MED ORDER — INSULIN LISPRO 100 UNIT/ML (KWIKPEN): PEN_INJECTOR | SUBCUTANEOUS | Status: DC

## 2014-04-20 NOTE — Progress Notes (Signed)
Patient ID: Fernando Peterson, male   DOB: 11/17/1955, 58 y.o.   MRN: [id number]    Reason for Appointment: Followup for Type 2 Diabetes  Referring physician: NNODI  History of Present Illness:          Diagnosis: Type 2 diabetes mellitus, date of diagnosis: 1992        Past history: He was initially treated with oral agents such as Glucotrol, Glucophage, Glucovance and Actos In 2001 he was put on insulin because of poor control and apparently also had liver enzyme abnormalities Since then he has had difficulty controlling his diabetes except with high doses of insulin and adding agents such as Symlin In 2000 he was taking a total of 225 units of 3 types of insulin including NPH at bedtime because of significantly high fasting readings He has been seen by various physicians in the last couple of years and is using various regimens for his diabetes Review of his A1c records indicate that blood sugars have been mostly poorly controlled with A1c range 7.5-9.9 with only one reading below 8% which was in 08/2013 At that time he had just come off Victoza which he had taken for 6 months at 1.8 mg dose He thinks that with Victoza blood sugars were generally been 100-130 range at various times He was taking this in combination with only Lantus insulin along with Invokana  Recent history:  Because of poor control and glucose readings consistently over 200 he was seen in consultation in 7/15 His Victoza was restarted since 12/30/13 and he has titrated this to 1.8 mg without any side effects His blood sugars are improved with Victoza And recent A1c is down to 7.1 He did not bring his monitor for download today  He will have high readings after meals and some high fasting readings which she cannot explain He has checked only one blood sugar after evening meal  recently He was recommended NovoLog to take if postprandial readings were higher but he did not call for a prescription He continues to take  [place] which was started a year ago   Also has continued his previous regimen of metformin and 15 mg Actos  Oral hypoglycemic drugs the patient is taking are:   Invokana 300 mg, metformin 850mg  3 times a day and Actos     Side effects from medications have been: None INSULIN regimen is described as:   85 Lantus at 9-10 pm Compliance with the medical regimen: Good  Hypoglycemia:   none  Glucose monitoring:  done 0.6 times a day         Glucometer: Accu-Chek      Blood Glucose readings by recall:   PREMEAL Breakfast 11 AM 2-6 PM PCS Overall  Glucose range: 104-302 104-200 121-151 212   Mean/median: 170    161    Glycemic control: A1c was 9.9% on 12/11/13  Urine microalbumin ratio in 3/15 was 339 but has been as high as 15,772   Lab Results  Component Value Date   HGBA1C 7.1* 04/06/2014   HGBA1C * 05/25/2010    9.6 (NOTE)  According to the ADA Clinical Practice Recommendations for 2011, when HbA1c is used as a screening test:   >=6.5%   Diagnostic of Diabetes Mellitus           (if abnormal result  is confirmed)  5.7-6.4%   Increased risk of developing Diabetes Mellitus  References:Diagnosis and Classification of Diabetes Mellitus,Diabetes TDVV,6160,73(XTGGY 1):S62-S69 and Standards of Medical Care in         Diabetes - 2011,Diabetes IRSW,5462,70  (Suppl 1):S11-S61.   Lab Results  Component Value Date   LDLCALC 45 01/07/2014   CREATININE 0.93 04/06/2014    Retinal exam: Most recent: 8/14, reportedly no neuropathy    Self-care: The diet that the patient has been following is: Usually balanced with proteins at each meal and low fat  Meals: 3 meals per day. Breakfast is usually  boiled  eggs and oatmeal, has grilled chicken salad at lunch and baked chicken or steak at dinner. Snacks: fruit or crackers Exercise: Gym 3-4/7 30 minutes walking and weights also  Dietician visit: Most recent:? 2006              Weight  history:   Wt Readings from Last 3 Encounters:  04/20/14 230 lb 12.8 oz (104.69 kg)  04/06/14 231 lb 9.6 oz (105.053 kg)  02/01/14 231 lb 6 oz (104.951 kg)       Medication List       This list is accurate as of: 04/20/14  2:17 PM.  Always use your most recent med list.               ammonium lactate 12 % cream  Commonly known as:  AMLACTIN     carvedilol 12.5 MG tablet  Commonly known as:  COREG  Take 12.5 mg by mouth daily.     colesevelam 625 MG tablet  Commonly known as:  WELCHOL  Take 1,875 mg by mouth 2 (two) times daily with a meal.     gabapentin 300 MG capsule  Commonly known as:  NEURONTIN  Take 300 mg by mouth 4 (four) times daily.     insulin glargine 100 UNIT/ML injection  Commonly known as:  LANTUS  Inject 85 Units into the skin at bedtime.     INVOKANA 300 MG Tabs tablet  Generic drug:  canagliflozin  Take 300 mg by mouth daily.     ketoconazole 2 % shampoo  Commonly known as:  NIZORAL  Apply 1 application topically every other day.     Liraglutide 18 MG/3ML Sopn  Commonly known as:  VICTOZA  Inject 1.8 mg into the skin daily.     losartan 100 MG tablet  Commonly known as:  COZAAR  Take 100 mg by mouth daily.     metFORMIN 850 MG tablet  Commonly known as:  GLUCOPHAGE  Take 850 mg by mouth 3 (three) times daily with meals.     NEXIUM 40 MG capsule  Generic drug:  esomeprazole  Take 1 capsule by mouth daily.     oxyCODONE-acetaminophen 5-325 MG per tablet  Commonly known as:  PERCOCET  Take 1-2 tablets by mouth every 4 (four) hours as needed.     PATADAY 0.2 % Soln  Generic drug:  Olopatadine HCl  Place 1 drop into both eyes daily.     pioglitazone 15 MG tablet  Commonly known as:  ACTOS  Take 15 mg by mouth daily.     simvastatin 10 MG tablet  Commonly known as:  ZOCOR  Take  10 mg by mouth daily.     triamterene-hydrochlorothiazide 37.5-25 MG per tablet  Commonly known as:  MAXZIDE-25  Take 1 tablet by mouth daily.         Allergies:  Allergies  Allergen Reactions  . Penicillin G Swelling    Eyes swollen    Past Medical History  Diagnosis Date  . Hypertension   . Diabetes mellitus   . GERD (gastroesophageal reflux disease)   . Hepatitis C   . Edema   . Hypercholesteremia   . Vitamin B12 deficiency   . Dyslipidemia   . Proteinuria   . Mixed hyperlipidemia   . Obesity   . Erectile dysfunction   . Anemia   . Shortness of breath     with exertion    Past Surgical History  Procedure Laterality Date  . Cervical disc surgery    . Penile prosthesis implant    . Mandible fracture surgery    . Multiple extractions with alveoloplasty N/A 01/26/2014    Procedure: MULTIPLE EXTRACTION WITH ALVEOLOPLASTY;  Surgeon: Gae Bon, DDS;  Location: [place];  Service: Oral Surgery;  Laterality: N/A;    Family History  Problem Relation Age of Onset  . Diabetes Mother   . Heart disease Mother   . Heart disease Sister   . Diabetes Brother   . Heart disease Brother     Social History:  reports that he has quit smoking. He has never used smokeless tobacco. He reports that he does not drink alcohol or use illicit drugs.    Review of Systems       Lipids: He has been only on low dose simvastatin along the WelChol       Lab Results  Component Value Date   CHOL 114 01/07/2014   HDL 36.20* 01/07/2014   LDLCALC 45 01/07/2014   TRIG 164.0* 01/07/2014   CHOLHDL 3 01/07/2014                  He has a long history of  Numbness, sharp pains and burning in feet treated with Neurontin, asking about alternatives but does not want to pay for brand name medications and did not tolerate Cymbalta apparently   Physical Examination:  BP 129/77 mmHg  Pulse 88  Temp(Src) 98 F (36.7 C)  Resp 14  Ht 5\' 11"  (1.803 m)  Wt 230 lb 12.8 oz (104.69 kg)  BMI 32.20 kg/m2  SpO2 95%   no edema    ASSESSMENT:  Diabetes type 2, uncontrolled  His blood sugars appear to be improving with  Victoza  1.8 mg an A1c  is near 7% However he has some significantly high readings fasting probably because of high postprandial readings the night before depending on his type of meal He has only one reading after his evening meal which was over 200 but otherwise afternoon readings are fairly good He is not on mealtime insulin currently but  on multiple oral agents and Lantus insulin Overall has been compliant with exercise and weight is stable However has not checked blood sugars as frequently as discussed before  NEUROPATHY: Still symptomatic and needing relatively large doses of Neurontin, he wants to continue this  PLAN:   He will monitor blood sugars more often after meals Start NovoLog 5-10 units before supper based on meal size  Discussed postprandial blood sugar targets and how to adjust the NovoLog as well as timing of the insulin and duration of action  No change in Lantus  as yet  Continue other drug regimen unchanged  Continue antihypertensives unchanged  Patient Instructions  Please check blood sugars at least half the time about 2 hours after any meal and 4-5 times per week on waking up. Please bring blood sugar monitor to each visit  Humalog insulin 5-10 units 10-15 min before supper based on type of meal to keep sugar at least < 180 after the meal    Counseling time over 50% of today's 25 minute visit    Nakota Ackert 04/20/2014, 2:17 PM   Note: This office note was prepared with Estate agent. Any transcriptional errors that result from this process are unintentional.

## 2014-04-20 NOTE — Patient Instructions (Addendum)
Please check blood sugars at least half the time about 2 hours after any meal and 4-5 times per week on waking up. Please bring blood sugar monitor to each visit  Humalog insulin 5-10 units 10-15 min before supper based on type of meal to keep sugar at least < 180 after the meal

## 2014-04-26 ENCOUNTER — Telehealth: Payer: Self-pay | Admitting: Endocrinology

## 2014-04-26 ENCOUNTER — Other Ambulatory Visit: Payer: Self-pay | Admitting: *Deleted

## 2014-04-26 MED ORDER — INSULIN PEN NEEDLE 31G X 5 MM MISC: Status: AC

## 2014-04-26 MED ORDER — INSULIN GLARGINE 100 UNIT/ML SOLOSTAR PEN: 85.0000 [IU] | PEN_INJECTOR | Freq: Every day | SUBCUTANEOUS | Status: DC

## 2014-04-26 NOTE — Telephone Encounter (Signed)
rx sent

## 2014-04-26 NOTE — Telephone Encounter (Signed)
Patient need new prescription of Lantus solostar, [facility] on [address] Fax# [phone]

## 2014-05-14 ENCOUNTER — Other Ambulatory Visit: Payer: Self-pay | Admitting: *Deleted

## 2014-05-14 ENCOUNTER — Telehealth: Payer: Self-pay | Admitting: Endocrinology

## 2014-05-14 MED ORDER — LIRAGLUTIDE 18 MG/3ML ~~LOC~~ SOPN: 1.8000 mg | PEN_INJECTOR | Freq: Every day | SUBCUTANEOUS | Status: DC

## 2014-05-14 NOTE — Telephone Encounter (Signed)
Pt needs refill on victoza please

## 2014-05-14 NOTE — Telephone Encounter (Signed)
rx sent

## 2014-05-17 ENCOUNTER — Other Ambulatory Visit: Payer: Self-pay | Admitting: *Deleted

## 2014-05-17 ENCOUNTER — Telehealth: Payer: Self-pay | Admitting: Endocrinology

## 2014-05-17 MED ORDER — CANAGLIFLOZIN 300 MG PO TABS: 300.0000 mg | ORAL_TABLET | Freq: Every day | ORAL | Status: AC

## 2014-05-17 NOTE — Telephone Encounter (Signed)
Pt needs rx for invokana please

## 2014-07-14 ENCOUNTER — Ambulatory Visit: Payer: Medicare HMO | Admitting: Interventional Cardiology

## 2014-07-16 ENCOUNTER — Other Ambulatory Visit: Payer: Medicare HMO

## 2014-07-21 ENCOUNTER — Ambulatory Visit: Payer: Medicare HMO | Admitting: Endocrinology

## 2014-07-26 ENCOUNTER — Encounter: Payer: Self-pay | Admitting: Interventional Cardiology

## 2014-08-03 ENCOUNTER — Other Ambulatory Visit: Payer: Medicare HMO

## 2014-08-06 ENCOUNTER — Ambulatory Visit: Payer: Medicare HMO | Admitting: Endocrinology

## 2014-08-14 ENCOUNTER — Other Ambulatory Visit: Payer: Self-pay | Admitting: Endocrinology

## 2014-09-11 ENCOUNTER — Other Ambulatory Visit: Payer: Self-pay | Admitting: Endocrinology

## 2014-09-30 ENCOUNTER — Other Ambulatory Visit: Payer: Self-pay | Admitting: Endocrinology

## 2014-10-28 IMAGING — NM NM HEPATO W/GB/PHARM/[PERSON_NAME]
3 series · 18 of 18 positions shown · non-contrast
Comparison: CT abdomen and pelvis with contrast 02/05/2014

CLINICAL DATA: Cholelithiasis. History of right upper quadrant
pain, nausea, well seen, and intolerance to fatty foods.

EXAM:
NUCLEAR MEDICINE HEPATOBILIARY IMAGING
TECHNIQUE: Sequential images of the abdomen were obtained [DATE] minutes
following intravenous administration of radiopharmaceutical.
RADIOPHARMACEUTICALS:  1.0 [place] Wc-NNm Choletec

[he hepatobiliary · 3.43mm/px · 6 of 30 frames shown (1 of 3)]
[frame 3/30]
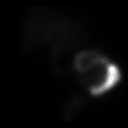
[frame 8/30]
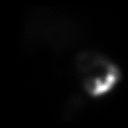
[frame 13/30]
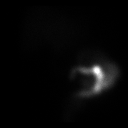
[frame 18/30]
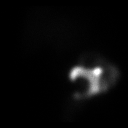
[frame 23/30]
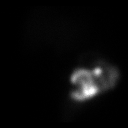
[frame 28/30]
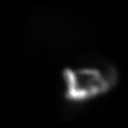

[he hepatobiliary · 3.43mm/px · 6 of 60 frames shown (2 of 3)]
[frame 6/60]
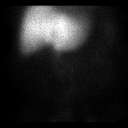
[frame 16/60]
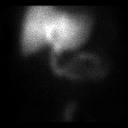
[frame 26/60]
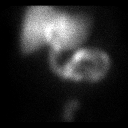
[frame 36/60]
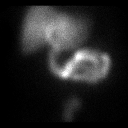
[frame 46/60]
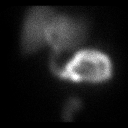
[frame 56/60]
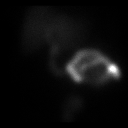

[he hepatobiliary · 3.43mm/px · 6 of 30 frames shown (3 of 3)]
[frame 3/30]
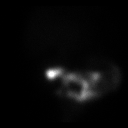
[frame 8/30]
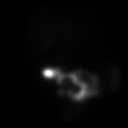
[frame 13/30]
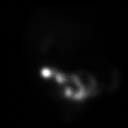
[frame 18/30]
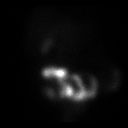
[frame 23/30]
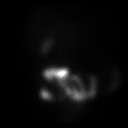
[frame 28/30]
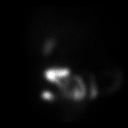

[18 of 18 positions shown; findings below may reference images not displayed]

FINDINGS: Prompt symmetric uptake of radiotracer is seen throughout the liver
parenchyma. Radiotracer activity is seen within the expected
location of the common bile duct by 10-15 min after injection, and
radiotracer activity is seen within the small bowel by 15 min and
increases over the course of the study.

After imaging for 90 min, no gallbladder filling was visualized.
Therefore, 4 mg of intravenous of morphine was administered to the
patient, per Dr. Hangonyi.

Filling of the gallbladder with radiotracer is seen by 10 min after
the administration of intravenous morphine. Filling of the
gallbladder continued over the remainder of the examination, which
was carried [DATE] min after administration morphine.
IMPRESSION: 1. Gallbladder filling is seen only after the injection of
intravenous morphine. No evidence of cholecystitis.
2. Given that morphine was required for this examination, an
ejection fraction could not be performed/calculated.

## 2014-11-02 ENCOUNTER — Other Ambulatory Visit: Payer: Self-pay | Admitting: Endocrinology

## 2014-11-02 NOTE — Telephone Encounter (Signed)
Patient is requesting a refill of his novolog, this was denied because the patient has not been seen since November 2015.  He needs to be seen before refills given

## 2014-11-12 ENCOUNTER — Telehealth: Payer: Self-pay | Admitting: Endocrinology

## 2014-11-12 NOTE — Telephone Encounter (Signed)
noted 

## 2014-11-12 NOTE — Telephone Encounter (Signed)
Pt has had money issues and needs to be seen on Monday. I informed the pt that they could be billed for the copay on Monday so he can come in and get his refills.

## 2014-11-15 ENCOUNTER — Encounter: Payer: Self-pay | Admitting: Endocrinology

## 2014-11-15 ENCOUNTER — Other Ambulatory Visit: Payer: Self-pay | Admitting: *Deleted

## 2014-11-15 ENCOUNTER — Ambulatory Visit (INDEPENDENT_AMBULATORY_CARE_PROVIDER_SITE_OTHER): Payer: Commercial Managed Care - HMO | Admitting: Endocrinology

## 2014-11-15 VITALS — BP 116/78 | HR 79 | Temp 97.8°F | Resp 16 | Ht 71.0 in | Wt 219.8 lb

## 2014-11-15 DIAGNOSIS — I952 Hypotension due to drugs: Secondary | ICD-10-CM | POA: Diagnosis not present

## 2014-11-15 DIAGNOSIS — E1165 Type 2 diabetes mellitus with hyperglycemia: Secondary | ICD-10-CM | POA: Diagnosis not present

## 2014-11-15 DIAGNOSIS — E1142 Type 2 diabetes mellitus with diabetic polyneuropathy: Secondary | ICD-10-CM | POA: Diagnosis not present

## 2014-11-15 DIAGNOSIS — G629 Polyneuropathy, unspecified: Secondary | ICD-10-CM | POA: Diagnosis not present

## 2014-11-15 DIAGNOSIS — IMO0002 Reserved for concepts with insufficient information to code with codable children: Secondary | ICD-10-CM

## 2014-11-15 MED ORDER — INSULIN GLARGINE 100 UNIT/ML SOLOSTAR PEN: PEN_INJECTOR | SUBCUTANEOUS | Status: DC

## 2014-11-15 MED ORDER — INSULIN LISPRO 100 UNIT/ML (KWIKPEN): PEN_INJECTOR | SUBCUTANEOUS | Status: DC

## 2014-11-15 MED ORDER — LIRAGLUTIDE 18 MG/3ML ~~LOC~~ SOPN: 1.8000 mg | PEN_INJECTOR | Freq: Every day | SUBCUTANEOUS | Status: DC

## 2014-11-15 NOTE — Progress Notes (Signed)
Patient ID: Fernando Peterson, male   DOB: July 17, 1955, 59 y.o.   MRN: [id number]    Reason for Appointment: Followup for Type 2 Diabetes  Referring physician: NNODI  History of Present Illness:          Diagnosis: Type 2 diabetes mellitus, date of diagnosis: 1992        Past history: He was initially treated with oral agents such as Glucotrol, Glucophage, Glucovance and Actos In 2001 he was put on insulin because of poor control and apparently also had liver enzyme abnormalities Since then he has had difficulty controlling his diabetes except with high doses of insulin and adding agents such as Symlin In 2000 he was taking a total of 225 units of 3 types of insulin including NPH at bedtime because of significantly high fasting readings He has been seen by various physicians in the last couple of years and is using various regimens for his diabetes Review of his A1c records indicate that blood sugars have been mostly poorly controlled with A1c range 7.5-9.9 with only one reading below 8% which was in 08/2013 At that time he had just come off Victoza which he had taken for 6 months at 1.8 mg dose He thinks that with Victoza blood sugars were generally been 100-130 range at various times He was taking this in combination with only Lantus insulin along with Invokana  Recent history:   INSULIN regimen was:  80 Lantus at 9-10 pm; Humalog 8 bid  He has not been seen in follow-up since 11/15 Because of financial issues he has not taken any insulin or Victoza for 2 months Previously with this regimen his A1c was down to 7.1 He also thinks that his blood sugars were excellent on this regimen with blood sugars around 100 in the morning and no more than 130 or so after eating However did not bring his blood sugar for review Recently his blood sugars have been mostly around 250 or higher He has lost weight and he thinks this was starting before stopping insulin  Also on his last visit he was advised  to start taking Humalog before meals and he is taking this now, does not usually eat much at lunchtime and is not taking midday Previously had no hypoglycemia He continues to take Invokana along with high-dose metformin and 15 mg Actos  With his high blood sugars and has been more thirsty but is avoiding drinks with sugar except some Powerade  Not clear if he has had an A1c done from his PCP recently  Oral hypoglycemic drugs the patient is taking are:   Invokana 300 mg, metformin 850mg  3 times a day and Actos     Side effects from medications have been: None  Compliance with the medical regimen: Poor  Hypoglycemia:   none  Glucose monitoring:           Glucometer: Accu-Chek      Blood Glucose readings  recently 250-320   Glycemic control: A1c was 9.9% on 12/11/13  Urine microalbumin ratio in 3/15 was 339 but has been as high as 15,772   Lab Results  Component Value Date   HGBA1C 7.1* 04/06/2014   HGBA1C * 05/25/2010    9.6 (NOTE)  According to the ADA Clinical Practice Recommendations for 2011, when HbA1c is used as a screening test:   >=6.5%   Diagnostic of Diabetes Mellitus           (if abnormal result  is confirmed)  5.7-6.4%   Increased risk of developing Diabetes Mellitus  References:Diagnosis and Classification of Diabetes Mellitus,Diabetes NFAO,1308,65(HQION 1):S62-S69 and Standards of Medical Care in         Diabetes - 2011,Diabetes GEXB,2841,32  (Suppl 1):S11-S61.   Lab Results  Component Value Date   LDLCALC 45 01/07/2014   CREATININE 0.93 04/06/2014    Retinal exam: Most recent: 11/15, reportedly no neuropathy    Self-care: The diet that the patient has been following is: Usually balanced with proteins at each meal and low fat  Meals: 3 meals per day. Breakfast is usually  boiled  eggs and oatmeal, has salad at lunch and baked chicken or steak at dinner.  Snacks: fruit or crackers Exercise: Gym  4/7  30 minutes walking and weights also  Dietician visit: Most recent:? 2006              Weight history:   Wt Readings from Last 3 Encounters:  11/15/14 219 lb 12.8 oz (99.701 kg)  04/20/14 230 lb 12.8 oz (104.69 kg)  04/06/14 231 lb 9.6 oz (105.053 kg)       Medication List       This list is accurate as of: 11/15/14 10:12 AM.  Always use your most recent med list.               ammonium lactate 12 % cream  Commonly known as:  AMLACTIN     canagliflozin 300 MG Tabs tablet  Commonly known as:  INVOKANA  Take 300 mg by mouth daily.     carvedilol 12.5 MG tablet  Commonly known as:  COREG  Take 12.5 mg by mouth daily.     colesevelam 625 MG tablet  Commonly known as:  WELCHOL  Take 1,875 mg by mouth 2 (two) times daily with a meal.     gabapentin 300 MG capsule  Commonly known as:  NEURONTIN  Take 300 mg by mouth 4 (four) times daily.     Insulin Glargine 100 UNIT/ML Solostar Pen  Commonly known as:  LANTUS SOLOSTAR  INJECT 85 UNITS UNDER THE SKIN NIGHTLY AT 10PM     insulin lispro 100 UNIT/ML KiwkPen  Commonly known as:  HUMALOG KWIKPEN  10 units ac qd-bid     Insulin Pen Needle 31G X 5 MM Misc  Use 2 per day     ketoconazole 2 % shampoo  Commonly known as:  NIZORAL  Apply 1 application topically every other day.     Liraglutide 18 MG/3ML Sopn  Commonly known as:  VICTOZA  Inject 0.3 mLs (1.8 mg total) into the skin daily.     losartan 100 MG tablet  Commonly known as:  COZAAR  Take 100 mg by mouth daily.     metFORMIN 850 MG tablet  Commonly known as:  GLUCOPHAGE  Take 850 mg by mouth 3 (three) times daily with meals.     NEXIUM 40 MG capsule  Generic drug:  esomeprazole  Take 1 capsule by mouth daily.     PATADAY 0.2 % Soln  Generic drug:  Olopatadine HCl  Place 1 drop into both eyes daily.     pioglitazone 15 MG tablet  Commonly known as:  ACTOS  Take 15 mg by mouth  daily.     simvastatin 10 MG tablet  Commonly known as:  ZOCOR  Take 10  mg by mouth daily.     triamterene-hydrochlorothiazide 37.5-25 MG per tablet  Commonly known as:  MAXZIDE-25  Take 1 tablet by mouth daily.        Allergies:  Allergies  Allergen Reactions  . Penicillin G Swelling    Eyes swollen    Past Medical History  Diagnosis Date  . Hypertension   . Diabetes mellitus   . GERD (gastroesophageal reflux disease)   . Hepatitis C   . Edema   . Hypercholesteremia   . Vitamin B12 deficiency   . Dyslipidemia   . Proteinuria   . Mixed hyperlipidemia   . Obesity   . Erectile dysfunction   . Anemia   . Shortness of breath     with exertion    Past Surgical History  Procedure Laterality Date  . Cervical disc surgery    . Penile prosthesis implant    . Mandible fracture surgery    . Multiple extractions with alveoloplasty N/A 01/26/2014    Procedure: MULTIPLE EXTRACTION WITH ALVEOLOPLASTY;  Surgeon: Gae Bon, DDS;  Location: [facility];  Service: Oral Surgery;  Laterality: N/A;    Family History  Problem Relation Age of Onset  . Diabetes Mother   . Heart disease Mother   . Heart disease Sister   . Diabetes Brother   . Heart disease Brother     Social History:  reports that he has quit smoking. He has never used smokeless tobacco. He reports that he does not drink alcohol or use illicit drugs.    Review of Systems   HYPERTENSION: He is on a regimen of Maxzide, Coreg and losartan He says that in the last couple of days he has felt lightheaded and yesterday his blood pressure was about 433 systolic.  He feels better today after increasing his salt intake last night      Lipids: He has been only on low dose simvastatin along the WelChol, no recent labs available       Lab Results  Component Value Date   CHOL 114 01/07/2014   HDL 36.20* 01/07/2014   LDLCALC 45 01/07/2014   TRIG 164.0* 01/07/2014   CHOLHDL 3 01/07/2014                  He has a long history of  Numbness, sharp pains and burning in feet treated with  Neurontin On his own he is taking 1200 mg twice a day  He did not tolerate Cymbalta apparently  Last exam 7/15  Physical Examination:  BP 116/78 mmHg  Pulse 79  Temp(Src) 97.8 F (36.6 C)  Resp 16  Ht 5\' 11"  (1.803 m)  Wt 219 lb 12.8 oz (99.701 kg)  BMI 30.67 kg/m2  SpO2 96%  Repeat standing blood pressure 116/78, initial blood pressure was 160/96  no edema    ASSESSMENT:  Diabetes type 2, uncontrolled  His blood sugars have totally gone up to control with his not refilling his insulin and Victoza prescriptions which were doing well previously See history of present illness for detailed discussion of his current management, blood sugar patterns and problems identified He is not controlled with his 3 oral agents and has lost weight as expected with hyperglycemia  NEUROPATHY: Less symptomatic now and needing relatively large doses of Neurontin which he is tolerating  PLAN:   He will restart all insulin doses but reduce  the Lantus to 60 units for now and gradually titrate this as discussed  He will restart Victoza but start with 0.6 mg and titrate up gradually  He needs to monitor blood sugars regularly and often after meals  Continue Humalog 8 units before meals for now and adjust to keep postprandial readings in target which were discussed  Consider combining Invokana and metformin for convenience  Stop Maxzide for now since he tends to have low normal blood pressure readings and this may be related to weight loss and continuing to take Invokana  He may try to reduce his gabapentin to 900 mg twice a day  Continue monitoring blood pressure periodically at home  Follow up in one month with A1c  Get records from PCP office  Patient Instructions  Check blood sugars on waking up .Marland Kitchen 3-4 .Marland Kitchen times a week Also check blood sugars about 2 hours after a meal and do this after different meals by rotation  Recommended blood sugar levels on waking up is 90-130 and about 2 hours  after meal is 140-180 Please bring blood sugar monitor to each visit.  Stop Triam-HCT  Lantus 60 lantus and adjust weekly by 5 till am sugar < 130  Humalog 2x daily  Victoza 0.6mg  for 3 days and go up to 1.2 and then 1.8   Counseling time on subjects discussed above is over 50% of today's 25 minute visit   Hersel Mcmeen 11/15/2014, 10:12 AM   Note: This office note was prepared with Estate agent. Any transcriptional errors that result from this process are unintentional.

## 2014-11-15 NOTE — Patient Instructions (Addendum)
Check blood sugars on waking up .Marland Kitchen 3-4 .Marland Kitchen times a week Also check blood sugars about 2 hours after a meal and do this after different meals by rotation  Recommended blood sugar levels on waking up is 90-130 and about 2 hours after meal is 140-180 Please bring blood sugar monitor to each visit.  Stop Triam-HCT  Lantus 60 lantus and adjust weekly by 5 till am sugar < 130  Humalog 2x daily  Victoza 0.6mg  for 3 days and go up to 1.2 and then 1.8

## 2014-12-09 ENCOUNTER — Other Ambulatory Visit: Payer: Medicare HMO

## 2014-12-14 ENCOUNTER — Ambulatory Visit: Payer: Medicare HMO | Admitting: Endocrinology

## 2014-12-14 ENCOUNTER — Telehealth: Payer: Self-pay | Admitting: Endocrinology

## 2014-12-14 ENCOUNTER — Encounter: Payer: Self-pay | Admitting: *Deleted

## 2014-12-14 DIAGNOSIS — Z0289 Encounter for other administrative examinations: Secondary | ICD-10-CM

## 2014-12-14 NOTE — Telephone Encounter (Signed)
Patient no showed today's appt. Please advise on how to follow up. °A. No follow up necessary. °B. Follow up urgent. Contact patient immediately. °C. Follow up necessary. Contact patient and schedule visit in ___ days. °D. Follow up advised. Contact patient and schedule visit in ____weeks. ° °

## 2014-12-14 NOTE — Telephone Encounter (Signed)
Letter mailed

## 2015-02-14 ENCOUNTER — Other Ambulatory Visit: Payer: Self-pay | Admitting: Endocrinology

## 2015-02-19 ENCOUNTER — Other Ambulatory Visit: Payer: Self-pay | Admitting: Endocrinology

## 2015-04-01 ENCOUNTER — Other Ambulatory Visit: Payer: Self-pay | Admitting: Endocrinology

## 2015-04-01 NOTE — Telephone Encounter (Signed)
Patient need refill of Lantus and Humalog send to  [facility] [place] (Phone) [phone] (Fax)

## 2015-04-04 ENCOUNTER — Telehealth: Payer: Self-pay | Admitting: Endocrinology

## 2015-04-04 NOTE — Telephone Encounter (Signed)
Pt wife calling to see if we can call in a bridge scripts of lantus and victoza and humalog until he can see his new endo md in [place] in December. They live in [place] now.

## 2015-04-05 MED ORDER — INSULIN GLARGINE 100 UNIT/ML SOLOSTAR PEN: PEN_INJECTOR | SUBCUTANEOUS | Status: AC

## 2015-04-05 MED ORDER — LIRAGLUTIDE 18 MG/3ML ~~LOC~~ SOPN: PEN_INJECTOR | SUBCUTANEOUS | Status: AC

## 2015-04-05 MED ORDER — INSULIN LISPRO 100 UNIT/ML (KWIKPEN): PEN_INJECTOR | SUBCUTANEOUS | Status: DC

## 2015-04-05 NOTE — Telephone Encounter (Signed)
Please read message below and advise.  

## 2015-04-05 NOTE — Telephone Encounter (Signed)
Patient wife called back, asking if someone would call her today.

## 2015-04-05 NOTE — Telephone Encounter (Signed)
Rx refills sent to local pharmacy. Tried to call pt and the numbers in the system are no longer activated.

## 2015-04-05 NOTE — Telephone Encounter (Signed)
Okay 

## 2015-04-13 ENCOUNTER — Other Ambulatory Visit: Payer: Self-pay | Admitting: *Deleted

## 2015-04-13 MED ORDER — INSULIN LISPRO 100 UNIT/ML (KWIKPEN): PEN_INJECTOR | SUBCUTANEOUS | Status: AC

## 2015-04-13 MED ORDER — COLESEVELAM HCL 625 MG PO TABS: 1875.0000 mg | ORAL_TABLET | Freq: Two times a day (BID) | ORAL | Status: AC

## 2015-08-07 ENCOUNTER — Other Ambulatory Visit: Payer: Self-pay | Admitting: Endocrinology

## 2015-08-10 ENCOUNTER — Telehealth: Payer: Self-pay | Admitting: *Deleted

## 2015-08-10 ENCOUNTER — Other Ambulatory Visit: Payer: Self-pay | Admitting: Endocrinology

## 2015-08-10 NOTE — Telephone Encounter (Signed)
Patient is requesting refills of Lantus and Victoza, these have been denied, patient has not been seen since June 2016, he needs an appointment before refills will be given.

## 2015-08-25 ENCOUNTER — Other Ambulatory Visit: Payer: Self-pay | Admitting: Endocrinology

## 2016-03-08 ENCOUNTER — Other Ambulatory Visit: Payer: Self-pay | Admitting: Endocrinology

## 2016-03-22 ENCOUNTER — Other Ambulatory Visit: Payer: Self-pay | Admitting: Endocrinology

## 2016-10-01 ENCOUNTER — Ambulatory Visit: Payer: Self-pay | Admitting: "Endocrinology

## 2016-10-18 ENCOUNTER — Encounter: Payer: Self-pay | Admitting: "Endocrinology

## 2016-10-18 ENCOUNTER — Ambulatory Visit: Payer: Self-pay | Admitting: "Endocrinology

## 2017-01-14 ENCOUNTER — Ambulatory Visit: Payer: Self-pay | Admitting: "Endocrinology

## 2017-01-14 ENCOUNTER — Encounter: Payer: Self-pay | Admitting: "Endocrinology

## 2019-03-16 ENCOUNTER — Telehealth: Payer: Self-pay | Admitting: Radiation Oncology

## 2019-03-16 ENCOUNTER — Encounter: Payer: Self-pay | Admitting: Radiation Oncology

## 2019-03-16 NOTE — Telephone Encounter (Signed)
New message:   Called patient on both lines and I was unable to leave a msg due to no [facility] bx being set up.

## 2019-03-17 ENCOUNTER — Encounter: Payer: Self-pay | Admitting: *Deleted

## 2019-03-18 ENCOUNTER — Telehealth: Payer: Self-pay | Admitting: Radiation Oncology

## 2019-03-18 NOTE — Telephone Encounter (Signed)
Unable to Leave voicemail to confirm appt and verify info.

## 2019-03-18 NOTE — Progress Notes (Signed)
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  He has had about 2 months of pain in his right hip and leg with difficulty ambulating.  Sites of Bony Metastatic Disease: Intertrochanteric mass in the right femur   CT Femur/Thigh 03/11/2019: Destructive osseous lesion in the right intertrochanteric proximal femur, with an intramedullary component and soft tissue component extending through the cortex, with measurements as detailed.  Please note this lesion places the patient at high risk for pathologic fracture, and the patient should be non-weightbearing.  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  Enlarged high left pathologic para-aortic lymph node measuring up to 2.2 cm in transverse concerning for metastatic disease.  Biopsy of Right Femur 03/09/2019   Past/Anticipated interventions by surgical: -Right surgical nail stabilization 03/12/2019  Past/Anticipated chemotherapy by medical oncology, if any:     Pain on a scale of 0-10 is: Pain in his right leg   Ambulatory status? Walker? Wheelchair?: Uses walker and cane  SAFETY ISSUES:  Prior radiation? No  Pacemaker/ICD? No  Possible current pregnancy? n/a  Is the patient on methotrexate? No  Current Complaints / other details

## 2019-03-19 ENCOUNTER — Encounter: Payer: Self-pay | Admitting: Radiation Oncology

## 2019-03-19 ENCOUNTER — Ambulatory Visit
Admission: RE | Admit: 2019-03-19 | Discharge: 2019-03-19 | Disposition: A | Discharge status: Home / Self Care | Payer: Medicaid - Out of State | Source: Ambulatory Visit | Attending: Radiation Oncology | Admitting: Radiation Oncology

## 2019-03-19 ENCOUNTER — Other Ambulatory Visit: Payer: Self-pay

## 2019-03-19 ENCOUNTER — Telehealth: Payer: Self-pay | Admitting: Radiation Oncology

## 2019-03-19 VITALS — Ht 67.0 in | Wt 240.0 lb

## 2019-03-19 DIAGNOSIS — C7951 Secondary malignant neoplasm of bone: Secondary | ICD-10-CM

## 2019-03-19 NOTE — Telephone Encounter (Signed)
The patient was scheduled for a visit today but we could not reach him as he is still hospitalized at [facility]. I tried his wife several times to discuss things, but could no reach her and had to leave a message on the pt's cell phone that we would need to reschedule his appt when he's out of the hospital.

## 2019-03-24 ENCOUNTER — Telehealth: Payer: Self-pay | Admitting: Radiation Oncology

## 2019-03-24 ENCOUNTER — Ambulatory Visit
Admission: RE | Admit: 2019-03-24 | Discharge: 2019-03-24 | Disposition: A | Discharge status: Home / Self Care | Payer: Self-pay | Source: Ambulatory Visit | Attending: Radiation Oncology | Admitting: Radiation Oncology

## 2019-03-24 ENCOUNTER — Other Ambulatory Visit: Payer: Self-pay | Admitting: Radiation Oncology

## 2019-03-24 DIAGNOSIS — C7951 Secondary malignant neoplasm of bone: Secondary | ICD-10-CM

## 2019-03-24 DIAGNOSIS — C220 Liver cell carcinoma: Secondary | ICD-10-CM

## 2019-03-24 NOTE — Telephone Encounter (Signed)
Unable to contact pt and leave voicemail to verify info.

## 2019-03-25 ENCOUNTER — Ambulatory Visit
Admission: RE | Admit: 2019-03-25 | Discharge: 2019-03-25 | Disposition: A | Discharge status: Home / Self Care | Payer: Medicaid - Out of State | Source: Ambulatory Visit | Attending: Radiation Oncology | Admitting: Radiation Oncology

## 2019-07-06 DEATH — deceased
